# Patient Record
Sex: Male | Born: 1995 | Race: White | Hispanic: No | Marital: Single | Smoking: Never smoker
Health system: Southern US, Community
[De-identification: ages and names within clinical notes are randomized; demographics above are authoritative.]

## PROBLEM LIST (undated history)

## (undated) HISTORY — PX: TONSILLECTOMY: SUR1361

---
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
## 2014-05-22 ENCOUNTER — Encounter (HOSPITAL_COMMUNITY): Payer: Self-pay | Admitting: Emergency Medicine

## 2014-05-22 ENCOUNTER — Emergency Department (HOSPITAL_COMMUNITY): Payer: Self-pay

## 2014-05-22 ENCOUNTER — Emergency Department (HOSPITAL_COMMUNITY)
Admission: EM | Admit: 2014-05-22 | Discharge: 2014-05-22 | Disposition: A | Discharge status: Home / Self Care | Payer: Self-pay | Attending: Emergency Medicine | Admitting: Emergency Medicine

## 2014-05-22 DIAGNOSIS — L02619 Cutaneous abscess of unspecified foot: Secondary | ICD-10-CM | POA: Insufficient documentation

## 2014-05-22 DIAGNOSIS — L03039 Cellulitis of unspecified toe: Principal | ICD-10-CM | POA: Insufficient documentation

## 2014-05-22 DIAGNOSIS — M79609 Pain in unspecified limb: Secondary | ICD-10-CM | POA: Insufficient documentation

## 2014-05-22 DIAGNOSIS — L03031 Cellulitis of right toe: Secondary | ICD-10-CM

## 2014-05-22 LAB — CBC WITH DIFFERENTIAL/PLATELET
BASOS ABS: 0 10*3/uL (ref 0.0–0.1)
BASOS PCT: 0 % (ref 0–1)
Eosinophils Absolute: 0.1 10*3/uL (ref 0.0–0.7)
Eosinophils Relative: 1 % (ref 0–5)
HCT: 43.7 % (ref 39.0–52.0)
Hemoglobin: 15.3 g/dL (ref 13.0–17.0)
Lymphocytes Relative: 20 % (ref 12–46)
Lymphs Abs: 2.1 10*3/uL (ref 0.7–4.0)
MCH: 30.8 pg (ref 26.0–34.0)
MCHC: 35 g/dL (ref 30.0–36.0)
MCV: 87.9 fL (ref 78.0–100.0)
MONO ABS: 0.8 10*3/uL (ref 0.1–1.0)
Monocytes Relative: 7 % (ref 3–12)
NEUTROS ABS: 7.5 10*3/uL (ref 1.7–7.7)
NEUTROS PCT: 72 % (ref 43–77)
PLATELETS: 258 10*3/uL (ref 150–400)
RBC: 4.97 MIL/uL (ref 4.22–5.81)
RDW: 12.7 % (ref 11.5–15.5)
WBC: 10.6 10*3/uL — ABNORMAL HIGH (ref 4.0–10.5)

## 2014-05-22 LAB — BASIC METABOLIC PANEL
ANION GAP: 15 (ref 5–15)
BUN: 11 mg/dL (ref 6–23)
CHLORIDE: 101 meq/L (ref 96–112)
CO2: 26 mEq/L (ref 19–32)
CREATININE: 1.05 mg/dL (ref 0.50–1.35)
Calcium: 9.4 mg/dL (ref 8.4–10.5)
GFR calc non Af Amer: >90 mL/min (ref >90)
Glucose, Bld: 91 mg/dL (ref 70–99)
POTASSIUM: 3.8 meq/L (ref 3.7–5.3)
SODIUM: 142 meq/L (ref 137–147)

## 2014-05-22 MED ORDER — SULFAMETHOXAZOLE-TRIMETHOPRIM 800-160 MG PO TABS: 1.0000 | ORAL_TABLET | Freq: Two times a day (BID) | ORAL | Status: AC

## 2014-05-22 MED ORDER — CEPHALEXIN 500 MG PO CAPS: 500.0000 mg | ORAL_CAPSULE | Freq: Four times a day (QID) | ORAL | Status: DC

## 2014-05-22 MED ORDER — CEFTRIAXONE SODIUM 1 G IJ SOLR
1.0000 g | Freq: Once | INTRAMUSCULAR | Status: AC
  Administered 2014-05-22: 1 g via INTRAMUSCULAR
  Filled 2014-05-22: qty 10

## 2014-05-22 MED ORDER — LIDOCAINE HCL (PF) 1 % IJ SOLN
INTRAMUSCULAR | Status: AC
  Administered 2014-05-22: 2.1 mL
  Filled 2014-05-22: qty 5

## 2014-05-22 NOTE — ED Notes (Signed)
Patient verbalizes understanding of discharge instructions, follow up care, prescription medications, and home care. Patient ambulatory out of department at this time with family.

## 2014-05-22 NOTE — ED Provider Notes (Signed)
CSN: [id number]     Arrival date & time 05/22/14  1715 History  This chart was scribed for Geoffery Lyons, MD by Gwenyth Ober, ED Scribe. This patient was seen in room APA04/APA04 and the patient's care was started at 6:24 PM.   Chief Complaint  Patient presents with  . Toe Pain   The history is provided by the patient. No language interpreter was used.   HPI Comments: Michael Andersen is a 18 y.o. male who presents to the Emergency Department complaining of constant, right great toe pain and swelling that started 3 days ago. He states he was wearing old boots and developed a blister. He states redness and drainage as associated symptoms. Pt denies a history of DM.   History reviewed. No pertinent past medical history. Past Surgical History  Procedure Laterality Date  . Tonsillectomy     History reviewed. No pertinent family history. History  Substance Use Topics  . Smoking status: Never Smoker   . Smokeless tobacco: Not on file  . Alcohol Use: No    Review of Systems 10 Systems reviewed and all are negative for acute change except as noted in the HPI.   Allergies  Review of patient's allergies indicates no known allergies.  Home Medications   Prior to Admission medications   Not on File   BP 148/88  Pulse 73  Temp(Src) 97.6 F (36.4 C) (Oral)  Resp 18  Ht  (1.854 m)  Wt 250 lb (113.399 kg)  BMI 32.99 kg/m2  SpO2 100% Physical Exam  Nursing note and vitals reviewed. Constitutional: He is oriented to person, place, and time. He appears well-developed and well-nourished. No distress.  HENT:  Head: Normocephalic and atraumatic.  Mouth/Throat: Oropharynx is clear and moist. No oropharyngeal exudate.  Eyes: Pupils are equal, round, and reactive to light.  Neck: Neck supple.  Cardiovascular: Normal rate.   Pulmonary/Chest: Effort normal.  Musculoskeletal: He exhibits no edema.  Neurological: He is alert and oriented to person, place, and time. No cranial nerve  deficit.  Skin: Skin is warm and dry. No rash noted.  The right great toe is warm, swollen, and erythematous. There is a small area at the base of the dorsal aspect that is draining purulent material.   Psychiatric: He has a normal mood and affect. His behavior is normal.    ED Course  Procedures (including critical care time)  DIAGNOSTIC STUDIES: Oxygen Saturation is 100% on RA, normal by my interpretation.    COORDINATION OF CARE: 6:26 PM Will order antibiotic. Discussed plan with pt and pt agreed to plan.     Labs Review Labs Reviewed  CBC WITH DIFFERENTIAL  BASIC METABOLIC PANEL    Imaging Review No results found.   EKG Interpretation None      MDM   Final diagnoses:  None    Purulent drainage from the toe with erythema.  He was given rocephin and will be treated with keflex, bactrim, and prn return.  I personally performed the services described in this documentation, which was scribed in my presence. The recorded information has been reviewed and is accurate.     Geoffery Lyons, MD 05/23/14 [phone]

## 2014-05-22 NOTE — ED Notes (Signed)
Swelling, redness, open area and purulent drainage to right great toe at distal joint. Pt reports he noticed open area this weekend. Denies injury.

## 2014-05-22 NOTE — Discharge Instructions (Signed)
Keflex and Bactrim as prescribed.  Return to the emergency department if your swelling worsens, you develop high fever, increased drainage from the wound, or red streaks up and down the leg.   Cellulitis Cellulitis is an infection of the skin and the tissue beneath it. The infected area is usually red and tender. Cellulitis occurs most often in the arms and lower legs.  CAUSES  Cellulitis is caused by bacteria that enter the skin through cracks or cuts in the skin. The most common types of bacteria that cause cellulitis are staphylococci and streptococci. SIGNS AND SYMPTOMS   Redness and warmth.  Swelling.  Tenderness or pain.  Fever. DIAGNOSIS  Your health care provider can usually determine what is wrong based on a physical exam. Blood tests may also be done. TREATMENT  Treatment usually involves taking an antibiotic medicine. HOME CARE INSTRUCTIONS   Take your antibiotic medicine as directed by your health care provider. Finish the antibiotic even if you start to feel better.  Keep the infected arm or leg elevated to reduce swelling.  Apply a warm cloth to the affected area up to 4 times per day to relieve pain.  Take medicines only as directed by your health care provider.  Keep all follow-up visits as directed by your health care provider. SEEK MEDICAL CARE IF:   You notice red streaks coming from the infected area.  Your red area gets larger or turns dark in color.  Your bone or joint underneath the infected area becomes painful after the skin has healed.  Your infection returns in the same area or another area.  You notice a swollen bump in the infected area.  You develop new symptoms.  You have a fever. SEEK IMMEDIATE MEDICAL CARE IF:   You feel very sleepy.  You develop vomiting or diarrhea.  You have a general ill feeling (malaise) with muscle aches and pains. MAKE SURE YOU:   Understand these instructions.  Will watch your condition.  Will get  help right away if you are not doing well or get worse. Document Released: 05/26/2005 Document Revised: 12/31/2013 Document Reviewed: 11/01/2011 [facility] Patient Information 2015 [place], [place]. This information is not intended to replace advice given to you by your health care provider. Make sure you discuss any questions you have with your health care provider.

## 2015-03-22 ENCOUNTER — Encounter (HOSPITAL_COMMUNITY): Payer: Self-pay | Admitting: Emergency Medicine

## 2015-03-22 ENCOUNTER — Inpatient Hospital Stay (HOSPITAL_COMMUNITY)
Admission: EM | Admit: 2015-03-22 | Discharge: 2015-03-26 | DRG: 603 | Disposition: A | Discharge status: Home / Self Care | Payer: Self-pay | Attending: Internal Medicine | Admitting: Internal Medicine

## 2015-03-22 ENCOUNTER — Emergency Department (HOSPITAL_COMMUNITY): Payer: Self-pay

## 2015-03-22 DIAGNOSIS — L0390 Cellulitis, unspecified: Secondary | ICD-10-CM | POA: Insufficient documentation

## 2015-03-22 DIAGNOSIS — L03211 Cellulitis of face: Principal | ICD-10-CM | POA: Diagnosis present

## 2015-03-22 LAB — BASIC METABOLIC PANEL
ANION GAP: 8 (ref 5–15)
BUN: 10 mg/dL (ref 6–20)
CALCIUM: 9 mg/dL (ref 8.9–10.3)
CO2: 28 mmol/L (ref 22–32)
Chloride: 101 mmol/L (ref 101–111)
Creatinine, Ser: 0.97 mg/dL (ref 0.61–1.24)
GFR calc Af Amer: >60 mL/min (ref >60)
GLUCOSE: 106 mg/dL — AB (ref 65–99)
POTASSIUM: 3.7 mmol/L (ref 3.5–5.1)
SODIUM: 137 mmol/L (ref 135–145)

## 2015-03-22 LAB — CBC WITH DIFFERENTIAL/PLATELET
BASOS ABS: 0 10*3/uL (ref 0.0–0.1)
Basophils Relative: 0 % (ref 0–1)
Eosinophils Absolute: 0.2 10*3/uL (ref 0.0–0.7)
Eosinophils Relative: 1 % (ref 0–5)
HEMATOCRIT: 43.4 % (ref 39.0–52.0)
HEMOGLOBIN: 15.1 g/dL (ref 13.0–17.0)
LYMPHS PCT: 10 % — AB (ref 12–46)
Lymphs Abs: 1.5 10*3/uL (ref 0.7–4.0)
MCH: 30.8 pg (ref 26.0–34.0)
MCHC: 34.8 g/dL (ref 30.0–36.0)
MCV: 88.4 fL (ref 78.0–100.0)
MONO ABS: 1 10*3/uL (ref 0.1–1.0)
MONOS PCT: 7 % (ref 3–12)
NEUTROS ABS: 12.4 10*3/uL — AB (ref 1.7–7.7)
Neutrophils Relative %: 82 % — ABNORMAL HIGH (ref 43–77)
Platelets: 223 10*3/uL (ref 150–400)
RBC: 4.91 MIL/uL (ref 4.22–5.81)
RDW: 12.7 % (ref 11.5–15.5)
WBC: 15.1 10*3/uL — ABNORMAL HIGH (ref 4.0–10.5)

## 2015-03-22 MED ORDER — SODIUM CHLORIDE 0.9 % IJ SOLN
3.0000 mL | Freq: Two times a day (BID) | INTRAMUSCULAR | Status: DC
Start: 2015-03-22 — End: 2015-03-26
  Administered 2015-03-22 – 2015-03-26 (×8): 3 mL via INTRAVENOUS

## 2015-03-22 MED ORDER — HYDROCODONE-ACETAMINOPHEN 5-325 MG PO TABS
1.0000 | ORAL_TABLET | Freq: Once | ORAL | Status: AC
  Administered 2015-03-22: 1 via ORAL
  Filled 2015-03-22: qty 1

## 2015-03-22 MED ORDER — VANCOMYCIN HCL IN DEXTROSE 1-5 GM/200ML-% IV SOLN: 1000.0000 mg | Freq: Two times a day (BID) | INTRAVENOUS | Status: DC

## 2015-03-22 MED ORDER — ACETAMINOPHEN 650 MG RE SUPP: 650.0000 mg | Freq: Four times a day (QID) | RECTAL | Status: DC | PRN

## 2015-03-22 MED ORDER — ONDANSETRON HCL 4 MG/2ML IJ SOLN: 4.0000 mg | Freq: Four times a day (QID) | INTRAMUSCULAR | Status: DC | PRN

## 2015-03-22 MED ORDER — VANCOMYCIN HCL IN DEXTROSE 1-5 GM/200ML-% IV SOLN
1000.0000 mg | Freq: Once | INTRAVENOUS | Status: AC
Start: 2015-03-22 — End: 2015-03-22
  Administered 2015-03-22: 1000 mg via INTRAVENOUS
  Filled 2015-03-22: qty 200

## 2015-03-22 MED ORDER — SODIUM CHLORIDE 0.9 % IV SOLN
250.0000 mL | INTRAVENOUS | Status: DC | PRN
  Administered 2015-03-25: 250 mL via INTRAVENOUS

## 2015-03-22 MED ORDER — VANCOMYCIN HCL IN DEXTROSE 1-5 GM/200ML-% IV SOLN
1000.0000 mg | Freq: Three times a day (TID) | INTRAVENOUS | Status: DC
  Administered 2015-03-23 – 2015-03-26 (×11): 1000 mg via INTRAVENOUS
  Filled 2015-03-22 (×14): qty 200

## 2015-03-22 MED ORDER — HYDROMORPHONE HCL 1 MG/ML IJ SOLN
0.5000 mg | Freq: Once | INTRAMUSCULAR | Status: AC
  Administered 2015-03-22: 0.5 mg via INTRAVENOUS
  Filled 2015-03-22: qty 1

## 2015-03-22 MED ORDER — VANCOMYCIN HCL IN DEXTROSE 1-5 GM/200ML-% IV SOLN
1000.0000 mg | Freq: Once | INTRAVENOUS | Status: AC
  Administered 2015-03-22: 1000 mg via INTRAVENOUS
  Filled 2015-03-22: qty 200

## 2015-03-22 MED ORDER — ACETAMINOPHEN 325 MG PO TABS: 650.0000 mg | ORAL_TABLET | Freq: Four times a day (QID) | ORAL | Status: DC | PRN

## 2015-03-22 MED ORDER — IOHEXOL 300 MG/ML  SOLN
75.0000 mL | Freq: Once | INTRAMUSCULAR | Status: AC | PRN
  Administered 2015-03-22: 75 mL via INTRAVENOUS

## 2015-03-22 MED ORDER — SODIUM CHLORIDE 0.9 % IJ SOLN: 3.0000 mL | INTRAMUSCULAR | Status: DC | PRN

## 2015-03-22 MED ORDER — HYDROMORPHONE HCL 1 MG/ML IJ SOLN
0.5000 mg | INTRAMUSCULAR | Status: DC | PRN
  Administered 2015-03-22 – 2015-03-25 (×19): 0.5 mg via INTRAVENOUS
  Filled 2015-03-22 (×19): qty 1

## 2015-03-22 MED ORDER — HYDROCODONE-ACETAMINOPHEN 5-325 MG PO TABS
1.0000 | ORAL_TABLET | ORAL | Status: DC | PRN
  Administered 2015-03-22: 2 via ORAL
  Filled 2015-03-22: qty 2

## 2015-03-22 MED ORDER — ONDANSETRON HCL 4 MG PO TABS: 4.0000 mg | ORAL_TABLET | Freq: Four times a day (QID) | ORAL | Status: DC | PRN

## 2015-03-22 NOTE — ED Notes (Signed)
Admitting at bedside 

## 2015-03-22 NOTE — Progress Notes (Signed)
ANTIBIOTIC CONSULT NOTE - INITIAL  Pharmacy Consult for Vancomycin Indication: cellulitis  No Known Allergies  Patient Measurements: Height:  (185.4 cm) Weight: 220 lb (99.791 kg) IBW/kg (Calculated) : 79.9 Adjusted Body Weight:   Vital Signs: Temp: 98.1 F (36.7 C) (07/23 0935) Temp Source: Oral (07/23 0935) BP: 150/98 mmHg (07/23 1300) Pulse Rate: 83 (07/23 1300) Intake/Output from previous day:   Intake/Output from this shift:    Labs:  Recent Labs  03/22/15 1024  WBC 15.1*  HGB 15.1  PLT 223  CREATININE 0.97   Estimated Creatinine Clearance: 151 mL/min (by C-G formula based on Cr of 0.97). No results for input(s): VANCOTROUGH, VANCOPEAK, VANCORANDOM, GENTTROUGH, GENTPEAK, GENTRANDOM, TOBRATROUGH, TOBRAPEAK, TOBRARND, AMIKACINPEAK, AMIKACINTROU, AMIKACIN in the last 72 hours.   Microbiology: No results found for this or any previous visit (from the past 720 hour(s)).  Medical History: History reviewed. No pertinent past medical history.  Medications:   (Not in a hospital admission) Assessment: 19 y.o. male who presents to the Emergency Department complaining of constant, worsening left-sided facial pain with associated swelling that has been present for three days. Pt states he tried to pop a pimple on his face two days ago and notes that facial swelling and tenderness have increased since that time. Vancomycin 1 GM IV given in ED Labs reviewed PTA medications reviewed  Goal of Therapy:  Vancomycin trough level 10-15 mcg/ml  Plan:  Additional Vancomycin 1 GM IV, then Vancomycin 1 GM IV every 8 hours Vancomycin trough at steady state Monitor renal function Labs per protocol  Raquel James, Avis Mcmahill Bennett 03/22/2015,2:03 PM

## 2015-03-22 NOTE — Progress Notes (Signed)
PAtient complain of itching after taking pain medications. Dr. Irene Limbo notified.

## 2015-03-22 NOTE — ED Provider Notes (Signed)
CSN: [id number]     Arrival date & time 03/22/15  4782 History  This chart was scribed for Bethann Berkshire, MD by Murriel Hopper, ED Scribe. This patient was seen in room APA18/APA18 and the patient's care was started at 9:58 AM.    Chief Complaint  Patient presents with  . Facial Swelling     Patient is a 19 y.o. male presenting with facial injury. The history is provided by the patient. No language interpreter was used.  Facial Injury Mechanism of injury:  Unable to specify Location:  Face and mouth Mouth location:  Lip(s) Time since incident:  3 days Pain details:    Quality:  Aching   Severity:  Moderate   Timing:  Constant   Progression:  Worsening Chronicity:  New Worsened by:  Nothing tried Associated symptoms: no congestion and no headaches      HPI Comments: Michael Andersen is a 19 y.o. male who presents to the Emergency Department complaining of constant, worsening left-sided facial pain with associated swelling that has been present for three days. Pt states he tried to pop a pimple on his face two days ago and notes that facial swelling and tenderness have increased since then. Pt notes that he went to [facility] yesterday for the same issue and received antibiotics, but notes that it has gotten worse since yesterday when he was seen. Pt denies fever or trying any other treatments for the concern.    History reviewed. No pertinent past medical history. Past Surgical History  Procedure Laterality Date  . Tonsillectomy     Family History  Problem Relation Age of Onset  . Cancer Other    History  Substance Use Topics  . Smoking status: Never Smoker   . Smokeless tobacco: Never Used  . Alcohol Use: No    Review of Systems  Constitutional: Negative for fever, appetite change and fatigue.  HENT: Positive for facial swelling. Negative for congestion, ear discharge and sinus pressure.   Eyes: Negative for discharge.  Respiratory: Negative for cough.    Cardiovascular: Negative for chest pain.  Gastrointestinal: Negative for abdominal pain and diarrhea.  Genitourinary: Negative for frequency and hematuria.  Musculoskeletal: Negative for back pain.  Skin: Negative for rash.  Neurological: Negative for seizures and headaches.  Psychiatric/Behavioral: Negative for hallucinations.  All other systems reviewed and are negative.     Allergies  Review of patient's allergies indicates no known allergies.  Home Medications   Prior to Admission medications   Medication Sig Start Date End Date Taking? Authorizing Provider  ibuprofen (ADVIL,MOTRIN) 200 MG tablet Take 400 mg by mouth every 6 (six) hours as needed (pain).   Yes Historical Provider, MD   BP 159/99 mmHg  Pulse 97  Temp(Src) 98.1 F (36.7 C) (Oral)  Resp 18  Ht 6\' 1"  (1.854 m)  Wt 220 lb (99.791 kg)  BMI 29.03 kg/m2  SpO2 97% Physical Exam  Constitutional: He is oriented to person, place, and time. He appears well-developed.  HENT:  Head: Normocephalic.  Cellulitis 2 cm in diameter above his left upper lip  Eyes: Conjunctivae and EOM are normal. No scleral icterus.  Neck: Neck supple. No tracheal deviation present. No thyromegaly present.  Cardiovascular: Normal rate and regular rhythm.  Exam reveals no gallop and no friction rub.   No murmur heard. Pulmonary/Chest: No stridor. He has no wheezes. He has no rales. He exhibits no tenderness.  Abdominal: He exhibits no distension. There is no tenderness. There is  no rebound.  Musculoskeletal: Normal range of motion. He exhibits no edema.  Lymphadenopathy:    He has no cervical adenopathy.  Neurological: He is oriented to person, place, and time. He exhibits normal muscle tone. Coordination normal.  Skin: Skin is warm. No rash noted. No erythema.  Psychiatric: He has a normal mood and affect. His behavior is normal.    ED Course  Procedures (including critical care time)  DIAGNOSTIC STUDIES: Oxygen Saturation is 97%  on room air, normal by my interpretation.    COORDINATION OF CARE: 10:07 AM Discussed treatment plan with pt at bedside and pt agreed to plan.   Labs Review Labs Reviewed - No data to display  Imaging Review No results found.   EKG Interpretation None      MDM   Final diagnoses:  None    Facial cellulitis admit   The chart was scribed for me under my direct supervision.  I personally performed the history, physical, and medical decision making and all procedures in the evaluation of this patient.Bethann Berkshire, MD 03/22/15 1318

## 2015-03-22 NOTE — ED Notes (Signed)
Patient c/o pain and swelling to left side of face. Per patient had a small pimple in which he tried to "pop" 2 days ago but started getting  Infected yesterday. Patient states he went to [facility] and was only seen in triage and given antibiotics. Per patient woke this morning with increased pain and swelling in face. Denies any difficulty with breathing or swallowing but reports "slight chest pain this morning." Denies any at this time.

## 2015-03-22 NOTE — Progress Notes (Signed)
Vicodin added as allergy. Vicodin order discontinued. MD notified.

## 2015-03-22 NOTE — ED Notes (Signed)
Attempted reprot.

## 2015-03-22 NOTE — H&P (Signed)
History and Physical  Michael Andersen:[id number] DOB: Jun 22, 1996 DOA: 03/22/2015  Referring physician: Dr Estell Harpin PCP: No PCP Per Patient   Chief Complaint: faiale pain  HPI:  19 y/o male with complaint of facial pain and swelling that onset 3-4 days ago gradually after bursting a pimple on his face with mild draining. He was seen at [facility] yesterday for sxs adn placed on abx, which he states did not provide any relief. He woke at 8am today to increased swelling and redness with a minimal drainage. Similar sxs before to the right great toe, he was treated with antibiotics, but not hospitalized. He was cultured in the past, believes it to have been positive for MRSA. He denies fever with the sxs.   In the emergency department afebrile, VSS, and non-hypoxic. Pertinent  labs: WBC 15.1, otherwise CBC unremarkable BMP unremarkable  Imaging:  Not independently reviewed left facial cellulitis, with no abscess.  Review of Systems:  Negative for fever, visual changes, sore throat, rash, new muscle aches, chest pain, SOB, dysuria, bleeding, n/v/d, or abdominal pain.  History reviewed. No pertinent past medical history.  Denies PMH  Past Surgical History  Procedure Laterality Date  . Tonsillectomy      Social History:  reports that he has never smoked. He has never used smokeless tobacco. He reports that he does not drink alcohol or use illicit drugs. Self-care  No Known Allergies  Family History  Problem Relation Age of Onset  . Cancer Other   denies particular problems in family   Prior to Admission medications   Medication Sig Start Date End Date Taking? Authorizing Provider  ibuprofen (ADVIL,MOTRIN) 200 MG tablet Take 400 mg by mouth every 6 (six) hours as needed (pain).   Yes Historical Provider, MD   Physical Exam: Filed Vitals:   03/22/15 1100 03/22/15 1130 03/22/15 1155 03/22/15 1200  BP: 140/99 102/60 102/60 133/77  Pulse:  65 84 84  Temp:      TempSrc:       Resp:   16   Height:      Weight:      SpO2:  99% 98% 100%    Afebrile, VSS, non-hypoxic General:  Appears calm and comfortable Eyes: PERRL, normal lids, irises & conjunctiva, EOMI ENT: grossly normal hearing, lips & tongue, good dentition, no carries.  Neck: no LAD, masses or thyromegaly Cardiovascular: RRR, no m/r/g. No LE edema. Respiratory: CTA bilaterally, no w/r/r. Normal respiratory effort. Abdomen: soft, non-tender, non-distented Skin: The left side of the face, above the lip, is indurated and erythemous with mild tenderness to palpation that extends to the left nare. There is no drainage or fluctuance noted. Musculoskeletal: grossly normal tone BUE/BLE  Psychiatric: grossly normal mood and affect, speech fluent and appropriate Neurologic: grossly non-focal.  Wt Readings from Last 3 Encounters:  03/22/15 99.791 kg (220 lb)  05/22/14 113.399 kg (250 lb) (99 %*, Z = 2.42)  08/21/13 117.935 kg (260 lb) (100 %*, Z = 2.58)   * Growth percentiles are based on CDC 2-20 Years data.    Labs on Admission:  Basic Metabolic Panel:  Recent Labs Lab 03/22/15 1024  NA 137  K 3.7  CL 101  CO2 28  GLUCOSE 106*  BUN 10  CREATININE 0.97  CALCIUM 9.0    CBC:  Recent Labs Lab 03/22/15 1024  WBC 15.1*  NEUTROABS 12.4*  HGB 15.1  HCT 43.4  MCV 88.4  PLT 223     Radiological Exams on Admission:  Ct Maxillofacial W/cm  03/22/2015   CLINICAL DATA:  Left facial swelling, redness and soreness for 2 days. Initial encounter.  EXAM: CT MAXILLOFACIAL WITH CONTRAST  TECHNIQUE: Multidetector CT imaging of the maxillofacial structures was performed with intravenous contrast. Multiplanar CT image reconstructions were also generated. A small metallic BB was placed on the right temple in order to reliably differentiate right from left.  CONTRAST:  75 mL OMNIPAQUE IOHEXOL 300 MG/ML  SOLN  COMPARISON:  None.  FINDINGS: There is infiltration of subcutaneous fat about the left side of the  face centered at the cheek. No focal fluid collection is identified. There is no lymphadenopathy or mass. The orbits appear normal. Imaged intracranial contents appear normal. No dental disease is identified. There is no focal bony abnormality. Imaged paranasal sinuses and mastoid air cells are clear.  IMPRESSION: Infiltration of subcutaneous fat about the left side of the face most consistent with cellulitis. No abscess is identified.   Electronically Signed   By: Drusilla Kanner M.D.   On: 03/22/2015 11:31      Principal Problem:   Facial cellulitis   Assessment/Plan 1. Facial cellulitis without evidance of complicating features. Suspect MRSA by history.   Admission to floor  Begin IV abx, follow clinically  Code Status: FULL  DVT prophylaxis:SCDs Family Communication: Discussed with patient at bedside. Disposition Plan/Anticipated LOS: admitted 1-2 days.  Time spent: 55 minutes  Brendia Sacks, MD  [facility] Pager [phone] 03/22/2015, 1:08 PM  I, Norg'e Tisdol, am scribing for Dr. Melton Alar. Michael Andersen on 03/22/2015 at 1:08 PM.  I have reviewed the above documentation for accuracy and completeness, and I agree with the above. Brendia Sacks, MD

## 2015-03-23 LAB — CBC
HEMATOCRIT: 42.3 % (ref 39.0–52.0)
Hemoglobin: 14.7 g/dL (ref 13.0–17.0)
MCH: 30.9 pg (ref 26.0–34.0)
MCHC: 34.8 g/dL (ref 30.0–36.0)
MCV: 88.9 fL (ref 78.0–100.0)
PLATELETS: 238 10*3/uL (ref 150–400)
RBC: 4.76 MIL/uL (ref 4.22–5.81)
RDW: 12.8 % (ref 11.5–15.5)
WBC: 14 10*3/uL — ABNORMAL HIGH (ref 4.0–10.5)

## 2015-03-23 MED ORDER — IBUPROFEN 600 MG PO TABS
600.0000 mg | ORAL_TABLET | Freq: Four times a day (QID) | ORAL | Status: DC
  Administered 2015-03-23 – 2015-03-26 (×13): 600 mg via ORAL
  Filled 2015-03-23 (×14): qty 1

## 2015-03-23 NOTE — Progress Notes (Signed)
ED/CM noted patient did not have health insurance and/or PCP listed in the computer. Patient was given the [facility] resources handout with information on the clinics, food pantries, and the handout for new health insurance sign-up. Provided drug discount card. Patient expressed appreciation for information received. Also, provided contact information to [facility].

## 2015-03-23 NOTE — Progress Notes (Signed)
PROGRESS NOTE  Michael Andersen ZOX:[id number] DOB: 1995/09/19 DOA: 03/22/2015 PCP: No PCP Per Patient  Summary: 19 y/o male with complaint of facial pain and swelling that onset 3-4 days ago gradually after bursting a pimple on his face with mild draining. He was seen at [facility] yesterday for sxs adn placed on abx, which he states did not provide any relief. He woke at 8am today to increased swelling and redness with a minimal drainage. Similar sxs before to the right great toe, he was treated with antibiotics, but not hospitalized. He was cultured in the past, believes it to have been positive for MRSA. He denies fever with the sxs.  Assessment/Plan: 1. Facial cellulitis without evidance of complicating features. Suspect MRSA by history. Slowly improving with less erythema and edema.   Overall improved  Likely home in next 48 hours.   May be able to change to oral abx in the morning.  Contine IV abx  Code Status: FULL DVT prophylaxis: SCDs Family Communication: Discussed with patient. Understands and has no concerns. 03/23/2015 Disposition Plan: Discharge home  Brendia Sacks, MD  [facility]  Pager [phone] If 7PM-7AM, please contact night-coverage at [email or web address], password [facility] 03/23/2015, 7:16 AM  LOS: 1 day   Consultants:    Procedures:    Antibiotics:  Vancomycin 03/23/15>>>  HPI/Subjective: Pt reports feeling better and noticing the swelling has decreased, but pain consistent. He is able to eat soft foods such as ice cream without difficulty. He denies n/v/, shortness of breath or chest pain.    Objective: Filed Vitals:   03/22/15 1407 03/22/15 1626 03/22/15 2108 03/23/15 0444  BP: 131/77 156/82 165/96 144/81  Pulse: 74 77 95 73  Temp:  98.8 F (37.1 C) 98.9 F (37.2 C) 99.6 F (37.6 C)  TempSrc:  Oral Oral Oral  Resp: 14  16 16   Height:  6\' 1"  (1.854 m)    Weight:  100.88 kg (222 lb 6.4 oz)    SpO2: 97% 98% 100% 97%    Intake/Output Summary  (Last 24 hours) at 03/23/15 0716 Last data filed at 03/23/15 0600  Gross per 24 hour  Intake    680 ml  Output      0 ml  Net    680 ml     Filed Weights   03/22/15 0935 03/22/15 1626  Weight: 99.791 kg (220 lb) 100.88 kg (222 lb 6.4 oz)    Exam:    Afebrile, VSS, non-hypoxic General:  Appears comfortable, calm. Cardiovascular: Regular rate and rhythm, no murmur, rub or gallop. No lower extremity edema. Telemetry: Sinus rhythm, no arrhythmias  Respiratory: Clear to auscultation bilaterally, no wheezes, rales or rhonchi. Normal respiratory effort Skin: Mouth looks good. Face less erythema and less edema of the proximal nare and less induration today. Small amount of drainage. Psychiatric: grossly normal mood and affect, speech fluent and appropriate  New data reviewed:  WBC slightly improved, 14  Otherwise CBC unremarkable  Pertinent data since admission:  CT Scan  Infiltration of subcutaneous fat about the left side of the face most consistent with cellulitis. No abscess is identified.  Pending data:    Scheduled Meds: . sodium chloride  3 mL Intravenous Q12H  . vancomycin  1,000 mg Intravenous Q8H   Continuous Infusions:   Principal Problem:   Facial cellulitis   Time spent 25 minutes  I, Jessica D. Jari Pigg, am scribing for Dr. Melton Alar. Irene Limbo. MD on 03/23/2015 at 7:16 AM   I have reviewed the  above documentation for accuracy and completeness, and I agree with the above. 03/23/2015 Brendia Sacks, MD

## 2015-03-23 NOTE — Progress Notes (Signed)
Utilization review Completed Ciarra Braddy RN BSN   

## 2015-03-24 LAB — BASIC METABOLIC PANEL
ANION GAP: 6 (ref 5–15)
BUN: 14 mg/dL (ref 6–20)
CALCIUM: 9.1 mg/dL (ref 8.9–10.3)
CO2: 30 mmol/L (ref 22–32)
Chloride: 102 mmol/L (ref 101–111)
Creatinine, Ser: 0.9 mg/dL (ref 0.61–1.24)
GFR calc Af Amer: >60 mL/min (ref >60)
Glucose, Bld: 97 mg/dL (ref 65–99)
Potassium: 4.1 mmol/L (ref 3.5–5.1)
Sodium: 138 mmol/L (ref 135–145)

## 2015-03-24 LAB — CBC
HEMATOCRIT: 43.5 % (ref 39.0–52.0)
Hemoglobin: 14.9 g/dL (ref 13.0–17.0)
MCH: 30.7 pg (ref 26.0–34.0)
MCHC: 34.3 g/dL (ref 30.0–36.0)
MCV: 89.7 fL (ref 78.0–100.0)
PLATELETS: 243 10*3/uL (ref 150–400)
RBC: 4.85 MIL/uL (ref 4.22–5.81)
RDW: 13 % (ref 11.5–15.5)
WBC: 10.8 10*3/uL — AB (ref 4.0–10.5)

## 2015-03-24 LAB — HIV ANTIBODY (ROUTINE TESTING W REFLEX): HIV Screen 4th Generation wRfx: NONREACTIVE

## 2015-03-24 MED ORDER — OXYCODONE-ACETAMINOPHEN 5-325 MG PO TABS
1.0000 | ORAL_TABLET | ORAL | Status: DC | PRN
  Administered 2015-03-24 – 2015-03-25 (×3): 1 via ORAL
  Filled 2015-03-24 (×4): qty 1

## 2015-03-24 NOTE — Progress Notes (Signed)
  PROGRESS NOTE  Michael Andersen XBJ:[id number] DOB: [birth year] DOA: 03/22/2015 PCP: No PCP Per Patient  Summary: 19 y/o male with complaint of facial pain and swelling that onset 3-4 days ago gradually after bursting a pimple on his face with mild draining. He was seen at [facility] yesterday for sxs adn placed on abx, which he states did not provide any relief. He woke at 8am today to increased swelling and redness with a minimal drainage. Similar sxs before to the right great toe, he was treated with antibiotics, but not hospitalized. He was cultured in the past, believes it to have been positive for MRSA. He denies fever with the sxs.  Assessment/Plan: 1. Facial cellulitis without evidance of complicating features. Continues to improve. Some "dependent" type edema but much less erythema.   Continue IV abx today. Likely change to PO in AM.  Anticipate d/c 7/26 if continues to improve.  Code Status: FULL DVT prophylaxis: SCDs Family Communication: none present. Patient alert and understands plan Disposition Plan: home when improved  Brendia Sacks, MD  [facility]  Pager [phone] If 7PM-7AM, please contact night-coverage at [email or web address], password [facility] 03/24/2015, 8:38 AM  LOS: 2 days   Consultants:    Procedures:    Antibiotics:  Vancomycin 03/23/15>>>  HPI/Subjective: Less erythema and edema of lip. More facial swelling. Overall better. Pain controlled. Eating well.  Objective: Filed Vitals:   03/23/15 0444 03/23/15 1427 03/23/15 2222 03/24/15 0629  BP: 144/81 142/82 145/70 144/68  Pulse: 73 74 63 84  Temp: 99.6 F (37.6 C) 99.1 F (37.3 C) 98 F (36.7 C) 97.9 F (36.6 C)  TempSrc: Oral Oral Oral Oral  Resp: Height:      Weight:      SpO2: 97% 99% 99% 95%    Intake/Output Summary (Last 24 hours) at 03/24/15 0838 Last data filed at 03/23/15 1700  Gross per 24 hour  Intake    720 ml  Output      0 ml  Net    720 ml     Filed Weights     03/22/15 0935 03/22/15 1626  Weight: 99.791 kg (220 lb) 100.88 kg (222 lb 6.4 oz)    Exam:    Afebrile, VSS, no hypoxia General: Appears calm and comfortable Cardiovascular: RRR, no m/r/g.  Respiratory: CTA bilaterally, no w/r/r. Normal respiratory effort. Skin: less edema upper left lip, less erythema of upper left lip and left nare; more swelling over maxillary area.  Psychiatric: grossly normal mood and affect, speech fluent and appropriate  New data reviewed:  WBC continues to trend downwards, 10.8  Pertinent data since admission:  CT Scan  Infiltration of subcutaneous fat about the left side of the face most consistent with cellulitis. No abscess is identified.  Pending data:    Scheduled Meds: . ibuprofen  600 mg Oral QID  . sodium chloride  3 mL Intravenous Q12H  . vancomycin  1,000 mg Intravenous Q8H   Continuous Infusions:   Principal Problem:   Facial cellulitis   Time spent 15 minutes

## 2015-03-24 NOTE — Care Management Note (Addendum)
Case Management Note  Patient Details  Name: ZENO HICKEL MRN: [id number] Date of Birth: 09-Sep-1995  Expected Discharge Date:                  Expected Discharge Plan:  Home/Self Care  In-House Referral:  NA, Financial Counselor  Discharge planning Services  CM Consult  Post Acute Care Choice:  NA Choice offered to:  NA  DME Arranged:    DME Agency:     HH Arranged:    HH Agency:     Status of Service:  In process, will continue to follow  Medicare Important Message Given:    Date Medicare IM Given:    Medicare IM give by:    Date Additional Medicare IM Given:    Additional Medicare Important Message give by:     If discussed at Long Length of Stay Meetings, dates discussed:    Additional Comments: Patient is from home and independent with ADL's. Pt uninsured and unemployed. Pt referred to financial counselor. Pt has no PCP, CM offered to set patient up with appointment at the [facility] or [facility] health dept. But pt would like to arrange for PCP follow up himself. Pt will be discharged on PO abx, if abx not on $4 list patient will be given [facility] voucher. Will cont to follow, anticipate DC 03/25/2015.  Malcolm Metro, RN 03/24/2015, 1:38 PM

## 2015-03-25 NOTE — Progress Notes (Signed)
0827 Facial edema to LEFT side of patient's face noted to be increased this AM. Patient reported possibly sleeping on that side of his face during the night. HOB elevated and patient sitting up in bed at this time. MD notified.

## 2015-03-25 NOTE — Progress Notes (Signed)
ANTIBIOTIC CONSULT NOTE  Pharmacy Consult for Vancomycin Indication: cellulitis  Allergies  Allergen Reactions  . Vicodin [Hydrocodone-Acetaminophen] Itching    Patient Measurements: Height:  (185.4 cm) Weight: 222 lb 6.4 oz (100.88 kg) IBW/kg (Calculated) : 79.9 Adjusted Body Weight:   Vital Signs: Temp: 98.6 F (37 C) (07/26 0621) Temp Source: Oral (07/26 0621) BP: 127/74 mmHg (07/26 0621) Pulse Rate: 72 (07/26 0621) Intake/Output from previous day: 07/25 0701 - 07/26 0700 In: 240 [P.O.:240] Out: -  Intake/Output from this shift:    Labs:  Recent Labs  03/23/15 0604 03/24/15 0554  WBC 14.0* 10.8*  HGB 14.7 14.9  PLT 238 243  CREATININE  --  0.90   Estimated Creatinine Clearance: 163.5 mL/min (by C-G formula based on Cr of 0.9). No results for input(s): VANCOTROUGH, VANCOPEAK, VANCORANDOM, GENTTROUGH, GENTPEAK, GENTRANDOM, TOBRATROUGH, TOBRAPEAK, TOBRARND, AMIKACINPEAK, AMIKACINTROU, AMIKACIN in the last 72 hours.   Microbiology: No results found for this or any previous visit (from the past 720 hour(s)).  Medical History: History reviewed. No pertinent past medical history.  Medications:  Prescriptions prior to admission  Medication Sig Dispense Refill Last Dose  . ibuprofen (ADVIL,MOTRIN) 200 MG tablet Take 400 mg by mouth every 6 (six) hours as needed (pain).   03/21/2015 at Unknown time   Assessment: 19 y.o. male who presents to the Emergency Department complaining of constant, worsening left-sided facial pain with associated swelling that has been present for three days. Pt states he tried to pop a pimple on his face two days ago and notes that facial swelling and tenderness have increased since that time. Cellulitis is worse today L infraorbital area. Renal function stable.  WBC decreased, Afeb  Goal of Therapy:  Vancomycin trough level 10-15 mcg/ml  Plan:  Cont Vancomycin 1 GM IV every 8 hours Will check vanc trough if therapy continues  tomorrow Continue to monitor renal function, clinical progress   Dorlis Judice Poteet 03/25/2015,11:10 AM

## 2015-03-25 NOTE — Progress Notes (Signed)
[facility]                                                                              Patient Demographics  Michael Andersen, is a 19 y.o. male, DOB - 15-Apr-1996, ZOX:[id number]  Admit date - 03/22/2015   Admitting Physician Standley Brooking, MD  Outpatient Primary MD for the patient is No PCP Per Patient  LOS - 3   Chief Complaint  Patient presents with  . Facial Swelling      Summary: 19 y/o male with complaint of facial pain and swelling that onset 3-4 days ago gradually after bursting a pimple on his face with mild draining. He was seen at [facility] yesterday for sxs adn placed on abx, which he states did not provide any relief. He woke at 8am today to increased swelling and redness with a minimal drainage. Similar sxs before to the right great toe, he was treated with antibiotics, but not hospitalized. He was cultured in the past, believes it to have been positive for MRSA. He denies fever with the sxs.   Assessment & Plan   Facial cellulitis -Per patient, increased swelling noted on the left nasal bridge extending to left maxillary and infraorbital area -Erythema does appear to be improving -Will continue IV and a biotics today given that patient's cellulitis has worsened -Currently patient is afebrile with improving leukocytosis -CT maxillofacial showed infiltrations obtain a stat about the left side of the face most consistent with cellulitis, no abscess -Will also order warm compresses to be applied  Code Status: Full  Family Communication: None at bedside  Disposition Plan: Admitted.  Possible discharge 03/26/2015 is cellulitis has improved  Time Spent in minutes   30 minutes  Procedures  None  Consults   None  DVT Prophylaxis  SCDs  Lab Results  Component Value Date   PLT 243 03/24/2015    Medications  Scheduled Meds: . ibuprofen  600 mg Oral QID  . sodium chloride  3 mL Intravenous Q12H  . vancomycin  1,000 mg Intravenous Q8H   Continuous  Infusions:  PRN Meds:.sodium chloride, acetaminophen **OR** acetaminophen, HYDROmorphone (DILAUDID) injection, ondansetron **OR** ondansetron (ZOFRAN) IV, oxyCODONE-acetaminophen, sodium chloride  Antibiotics    Anti-infectives    Start     Dose/Rate Route Frequency Ordered Stop   03/23/15 0000  vancomycin (VANCOCIN) IVPB 1000 mg/200 mL premix     1,000 mg 200 mL/hr over 60 Minutes Intravenous Every 8 hours 03/22/15 1438     03/22/15 1445  vancomycin (VANCOCIN) IVPB 1000 mg/200 mL premix     1,000 mg 200 mL/hr over 60 Minutes Intravenous  Once 03/22/15 1438 03/22/15 1645   03/22/15 1415  vancomycin (VANCOCIN) IVPB 1000 mg/200 mL premix  Status:  Discontinued     1,000 mg 200 mL/hr over 60 Minutes Intravenous Every 12 hours 03/22/15 1412 03/22/15 1412   03/22/15 1015  vancomycin (VANCOCIN) IVPB 1000 mg/200 mL premix     1,000 mg 200 mL/hr over 60 Minutes Intravenous  Once 03/22/15 1007 03/22/15 1151      Subjective:   Michael Andersen seen and examined today.  Patient states he is doing okay. Feels that his infection has worsened  some particularly under the left eye with some hardness as well as swelling. Patient does also complain of tenderness. Patient states that the infection first started as a pimple above the lip and had migrated to the left mandibular area however that has resolved. Patient currently denies any chest pain, shortness of breath, abdominal pain, nausea, vomiting, diarrhea, constipation, dizziness, visual changes.   Objective:   Filed Vitals:   03/24/15 0629 03/24/15 1558 03/24/15 2151 03/25/15 0621  BP: 144/68 136/63 128/81 127/74  Pulse: 84 66 81 72  Temp: 97.9 F (36.6 C) 98 F (36.7 C) 98.5 F (36.9 C) 98.6 F (37 C)  TempSrc: Oral Oral Oral Oral  Resp: 18 20 20 20   Height:      Weight:      SpO2: 95% 99% 100% 100%    Wt Readings from Last 3 Encounters:  03/22/15 100.88 kg (222 lb 6.4 oz)  05/22/14 113.399 kg (250 lb) (99 %*, Z = 2.42)  08/21/13  117.935 kg (260 lb) (100 %*, Z = 2.58)   * Growth percentiles are based on CDC 2-20 Years data.     Intake/Output Summary (Last 24 hours) at 03/25/15 1055 Last data filed at 03/25/15 0200  Gross per 24 hour  Intake    240 ml  Output      0 ml  Net    240 ml    Exam  General: Well developed, well nourished, NAD, appears stated age  HEENT: NCAT, Slight erythema and edema noted on left maxillary and intraocular region, small area-firm to palpation on left maxillary and nasal areas, mucous membranes moist.   Cardiovascular: S1 S2 auscultated, no rubs, murmurs or gallops. Regular rate and rhythm.  Respiratory: Clear to auscultation bilaterally with equal chest rise  Abdomen: Soft, nontender, nondistended, + bowel sounds  Extremities: warm dry without cyanosis clubbing or edema  Neuro: AAOx3, nonfocal  Psych: Normal affect and demeanor with intact judgement and insight  Data Review   Micro Results No results found for this or any previous visit (from the past 240 hour(s)).  Radiology Reports Ct Maxillofacial W/cm  03/22/2015   CLINICAL DATA:  Left facial swelling, redness and soreness for 2 days. Initial encounter.  EXAM: CT MAXILLOFACIAL WITH CONTRAST  TECHNIQUE: Multidetector CT imaging of the maxillofacial structures was performed with intravenous contrast. Multiplanar CT image reconstructions were also generated. A small metallic BB was placed on the right temple in order to reliably differentiate right from left.  CONTRAST:  75 mL OMNIPAQUE IOHEXOL 300 MG/ML  SOLN  COMPARISON:  None.  FINDINGS: There is infiltration of subcutaneous fat about the left side of the face centered at the cheek. No focal fluid collection is identified. There is no lymphadenopathy or mass. The orbits appear normal. Imaged intracranial contents appear normal. No dental disease is identified. There is no focal bony abnormality. Imaged paranasal sinuses and mastoid air cells are clear.  IMPRESSION:  Infiltration of subcutaneous fat about the left side of the face most consistent with cellulitis. No abscess is identified.   Electronically Signed   By: Drusilla Kanner M.D.   On: 03/22/2015 11:31    CBC  Recent Labs Lab 03/22/15 1024 03/23/15 0604 03/24/15 0554  WBC 15.1* 14.0* 10.8*  HGB 15.1 14.7 14.9  HCT 43.4 42.3 43.5  PLT 223 238 243  MCV 88.4 88.9 89.7  MCH 30.8 30.9 30.7  MCHC 34.8 34.8 34.3  RDW 12.7 12.8 13.0  LYMPHSABS 1.5  --   --  MONOABS 1.0  --   --   EOSABS 0.2  --   --   BASOSABS 0.0  --   --     Chemistries   Recent Labs Lab 03/22/15 1024 03/24/15 0554  NA 137 138  K 3.7 4.1  CL 101 102  CO2 28 30  GLUCOSE 106* 97  BUN 10 14  CREATININE 0.97 0.90  CALCIUM 9.0 9.1   ------------------------------------------------------------------------------------------------------------------ estimated creatinine clearance is 163.5 mL/min (by C-G formula based on Cr of 0.9). ------------------------------------------------------------------------------------------------------------------ No results for input(s): HGBA1C in the last 72 hours. ------------------------------------------------------------------------------------------------------------------ No results for input(s): CHOL, HDL, LDLCALC, TRIG, CHOLHDL, LDLDIRECT in the last 72 hours. ------------------------------------------------------------------------------------------------------------------ No results for input(s): TSH, T4TOTAL, T3FREE, THYROIDAB in the last 72 hours.  Invalid input(s): FREET3 ------------------------------------------------------------------------------------------------------------------ No results for input(s): VITAMINB12, FOLATE, FERRITIN, TIBC, IRON, RETICCTPCT in the last 72 hours.  Coagulation profile No results for input(s): INR, PROTIME in the last 168 hours.  No results for input(s): DDIMER in the last 72 hours.  Cardiac Enzymes No results for input(s): CKMB,  TROPONINI, MYOGLOBIN in the last 168 hours.  Invalid input(s): CK ------------------------------------------------------------------------------------------------------------------ Invalid input(s): POCBNP    Wassim Kirksey D.O. on 03/25/2015 at 10:55 AM  Between 7am to 7pm - Pager - [phone]  After 7pm go to [email or web address] - password [email or web address]  And look for the night coverage person covering for me after hours  [facility] Office  [phone]

## 2015-03-26 DIAGNOSIS — L03211 Cellulitis of face: Principal | ICD-10-CM

## 2015-03-26 LAB — CBC
HEMATOCRIT: 41.8 % (ref 39.0–52.0)
HEMOGLOBIN: 14 g/dL (ref 13.0–17.0)
MCH: 30 pg (ref 26.0–34.0)
MCHC: 33.5 g/dL (ref 30.0–36.0)
MCV: 89.5 fL (ref 78.0–100.0)
Platelets: 269 10*3/uL (ref 150–400)
RBC: 4.67 MIL/uL (ref 4.22–5.81)
RDW: 12.8 % (ref 11.5–15.5)
WBC: 8.1 10*3/uL (ref 4.0–10.5)

## 2015-03-26 LAB — BASIC METABOLIC PANEL
Anion gap: 6 (ref 5–15)
BUN: 17 mg/dL (ref 6–20)
CALCIUM: 8.9 mg/dL (ref 8.9–10.3)
CO2: 30 mmol/L (ref 22–32)
CREATININE: 0.9 mg/dL (ref 0.61–1.24)
Chloride: 104 mmol/L (ref 101–111)
GFR calc Af Amer: >60 mL/min (ref >60)
GFR calc non Af Amer: >60 mL/min (ref >60)
Glucose, Bld: 97 mg/dL (ref 65–99)
Potassium: 4.2 mmol/L (ref 3.5–5.1)
Sodium: 140 mmol/L (ref 135–145)

## 2015-03-26 MED ORDER — DOXYCYCLINE HYCLATE 100 MG PO TABS: 100.0000 mg | ORAL_TABLET | Freq: Two times a day (BID) | ORAL | Status: AC

## 2015-03-26 NOTE — Progress Notes (Signed)
Removed IV, pt tolerated well.  Reviewed discharge instrucions with pt, answered all questions at this time.

## 2015-03-26 NOTE — Discharge Summary (Signed)
Physician Discharge Summary  Michael Andersen ZOX:[id number] DOB: [birth year] DOA: 03/22/2015  PCP: No PCP Per Patient  Admit date: 03/22/2015 Discharge date: 03/26/2015  Time spent: 45 minutes  Recommendations for Outpatient Follow-up:  -Will be discharged home today. -Advised to follow-up with his new primary care provider within the next 2 weeks.   Discharge Diagnoses:  Principal Problem:   Facial cellulitis   Discharge Condition: Stable and improved  Filed Weights   03/22/15 0935 03/22/15 1626  Weight: 99.791 kg (220 lb) 100.88 kg (222 lb 6.4 oz)    History of present illness:  19 y/o male with complaint of facial pain and swelling that onset 3-4 days ago gradually after bursting a pimple on his face with mild draining. He was seen at [facility] yesterday for sxs adn placed on abx, which he states did not provide any relief. He woke at 8am today to increased swelling and redness with a minimal drainage. Similar sxs before to the right great toe, he was treated with antibiotics, but not hospitalized. He was cultured in the past, believes it to have been positive for MRSA. He denies fever with the sxs.   In the emergency department afebrile, VSS, and non-hypoxic.  Hospital Course:   Facial cellulitis -Hardly any erythema or edema today. -Per patient much improved. -Has been afebrile and leukocytosis has resolved. -We'll transition oral doxycycline to complete 10 more days and advised follow-up with new primary care provider within 2 weeks. -Stable for discharge home today.  Procedures:  None   Consultations:  None  Discharge Instructions     Medication List    TAKE these medications        doxycycline 100 MG tablet  Commonly known as:  VIBRA-TABS  Take 1 tablet (100 mg total) by mouth 2 (two) times daily.  Notes to Patient:  NEW PRESCRIPTION- Take as directed     ibuprofen 200 MG tablet  Commonly known as:  ADVIL,MOTRIN  Take 400 mg by mouth every 6 (six)  hours as needed (pain).       Allergies  Allergen Reactions  . Vicodin [Hydrocodone-Acetaminophen] Itching       Follow-up Information    Follow up In 2 weeks.   Why:  with your new primary care provider       The results of significant diagnostics from this hospitalization (including imaging, microbiology, ancillary and laboratory) are listed below for reference.    Significant Diagnostic Studies: Ct Maxillofacial W/cm  03/22/2015   CLINICAL DATA:  Left facial swelling, redness and soreness for 2 days. Initial encounter.  EXAM: CT MAXILLOFACIAL WITH CONTRAST  TECHNIQUE: Multidetector CT imaging of the maxillofacial structures was performed with intravenous contrast. Multiplanar CT image reconstructions were also generated. A small metallic BB was placed on the right temple in order to reliably differentiate right from left.  CONTRAST:  75 mL OMNIPAQUE IOHEXOL 300 MG/ML  SOLN  COMPARISON:  None.  FINDINGS: There is infiltration of subcutaneous fat about the left side of the face centered at the cheek. No focal fluid collection is identified. There is no lymphadenopathy or mass. The orbits appear normal. Imaged intracranial contents appear normal. No dental disease is identified. There is no focal bony abnormality. Imaged paranasal sinuses and mastoid air cells are clear.  IMPRESSION: Infiltration of subcutaneous fat about the left side of the face most consistent with cellulitis. No abscess is identified.   Electronically Signed   By: Drusilla Kanner M.D.   On: 03/22/2015  11:31    Microbiology: No results found for this or any previous visit (from the past 240 hour(s)).   Labs: Basic Metabolic Panel:  Recent Labs Lab 03/22/15 1024 03/24/15 0554 03/26/15 0545  NA 137 138 140  K 3.7 4.1 4.2  CL 101 102 104  CO2 28 30 30   GLUCOSE 106* 97 97  BUN 10 14 17   CREATININE 0.97 0.90 0.90  CALCIUM 9.0 9.1 8.9   Liver Function Tests: No results for input(s): AST, ALT, ALKPHOS,  BILITOT, PROT, ALBUMIN in the last 168 hours. No results for input(s): LIPASE, AMYLASE in the last 168 hours. No results for input(s): AMMONIA in the last 168 hours. CBC:  Recent Labs Lab 03/22/15 1024 03/23/15 0604 03/24/15 0554 03/26/15 0545  WBC 15.1* 14.0* 10.8* 8.1  NEUTROABS 12.4*  --   --   --   HGB 15.1 14.7 14.9 14.0  HCT 43.4 42.3 43.5 41.8  MCV 88.4 88.9 89.7 89.5  PLT 223 238 243 269   Cardiac Enzymes: No results for input(s): CKTOTAL, CKMB, CKMBINDEX, TROPONINI in the last 168 hours. BNP: BNP (last 3 results) No results for input(s): BNP in the last 8760 hours.  ProBNP (last 3 results) No results for input(s): PROBNP in the last 8760 hours.  CBG: No results for input(s): GLUCAP in the last 168 hours.     SignedChaya Jan  [facility] Pager: [phone] 03/26/2015, 4:51 PM

## 2015-03-26 NOTE — Care Management Note (Signed)
Case Management Note  Patient Details  Name: LAURICE KIMMONS MRN: [id number] Date of Birth: [birth year]  Expected Discharge Date:                  Expected Discharge Plan:  Home/Self Care  In-House Referral:  NA, Financial Counselor  Discharge planning Services  CM Consult, [facility]  Post Acute Care Choice:  NA Choice offered to:  NA  DME Arranged:    DME Agency:     HH Arranged:    HH Agency:     Status of Service:  Completed, signed off  Medicare Important Message Given:    Date Medicare IM Given:    Medicare IM give by:    Date Additional Medicare IM Given:    Additional Medicare Important Message give by:     If discussed at Long Length of Stay Meetings, dates discussed:    Additional Comments: Pt discharged home with self care today. Pt given [facility] voucher for doxycycline.  Malcolm Metro, RN 03/26/2015, 2:19 PM

## 2016-01-30 IMAGING — CT CT MAXILLOFACIAL W/ CM
3 of 5 series · 16 of 47 positions shown, 19 images · IV contrast (Omnipaque 300)
Comparison: None.

CLINICAL DATA: Left facial swelling, redness and soreness for 2
days. Initial encounter.

EXAM:
CT MAXILLOFACIAL WITH CONTRAST
TECHNIQUE: Multidetector CT imaging of the maxillofacial structures was
performed with intravenous contrast. Multiplanar CT image
reconstructions were also generated. A small metallic BB was placed
on the right temple in order to reliably differentiate right from
left.
CONTRAST:  75 mL OMNIPAQUE IOHEXOL 300 MG/ML  SOLN

[Series 2: max st axial · axial · 0.44mm/px · z∈[+9,+159]mm · 11 of 89 slices shown, 14 images]
[im 7/89  brain]
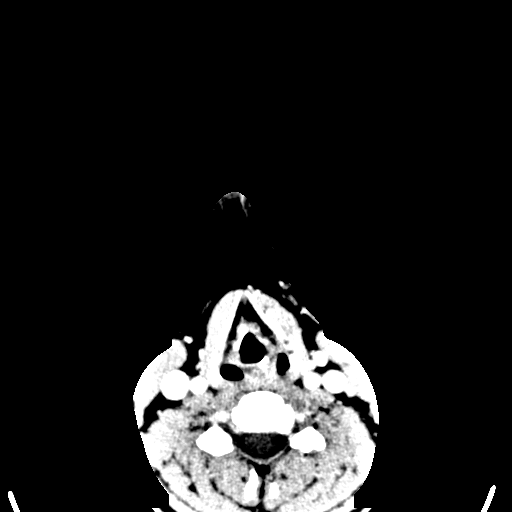
[im 7/89  bone]
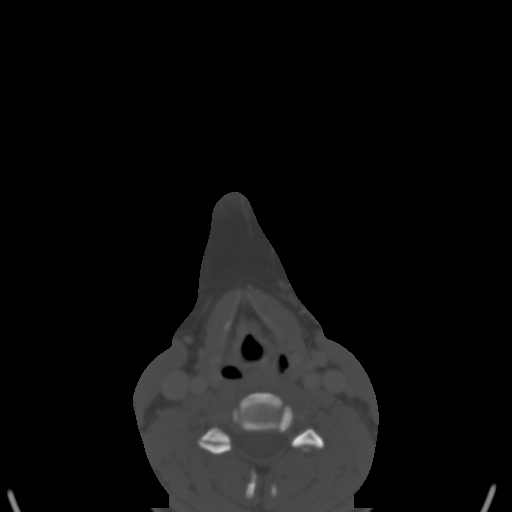
[im 13/89  bone]
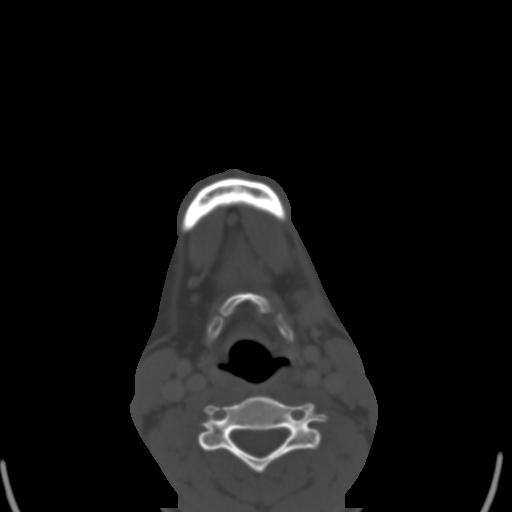
[im 22/89  bone]
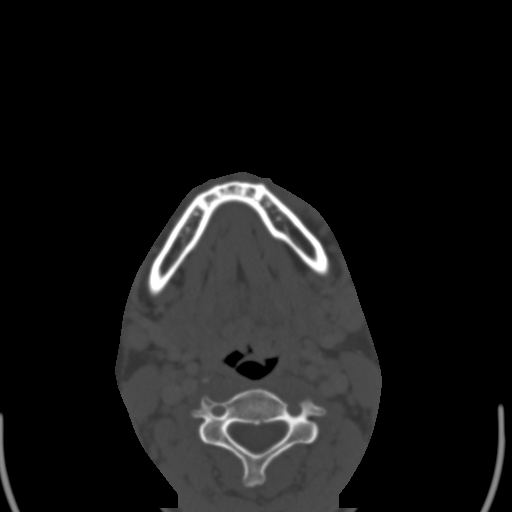
[im 28/89  bone]
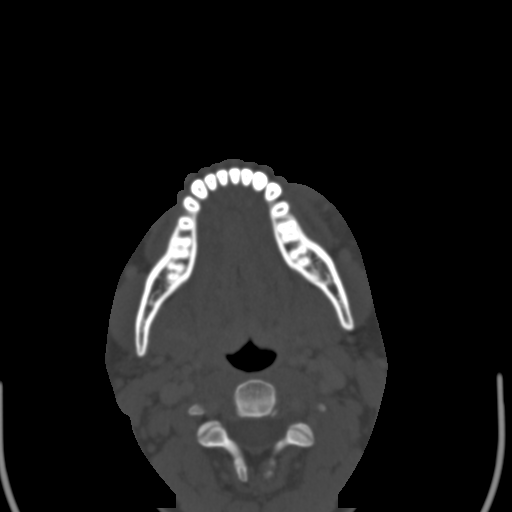
[im 37/89  brain]
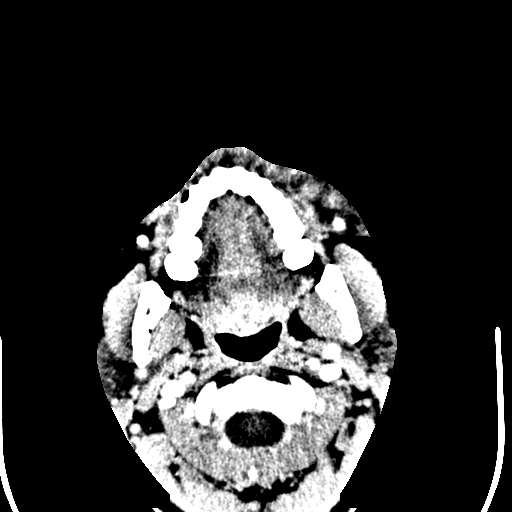
[im 37/89  bone]
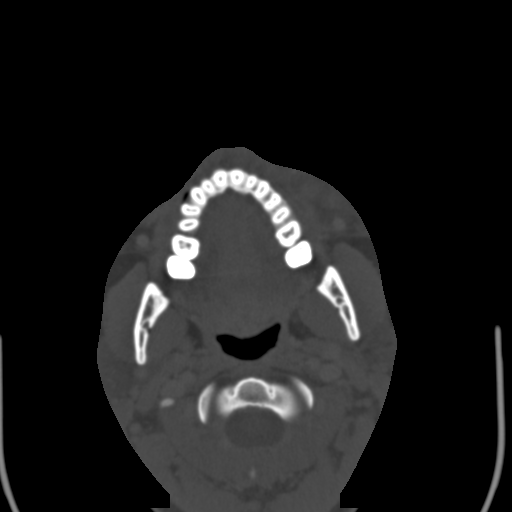
[im 46/89  bone]
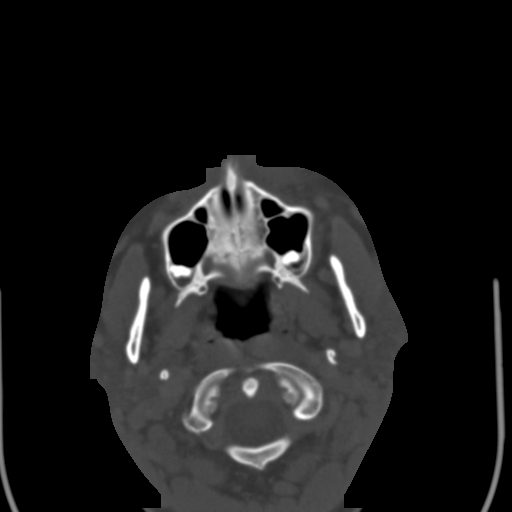
[im 52/89  bone]
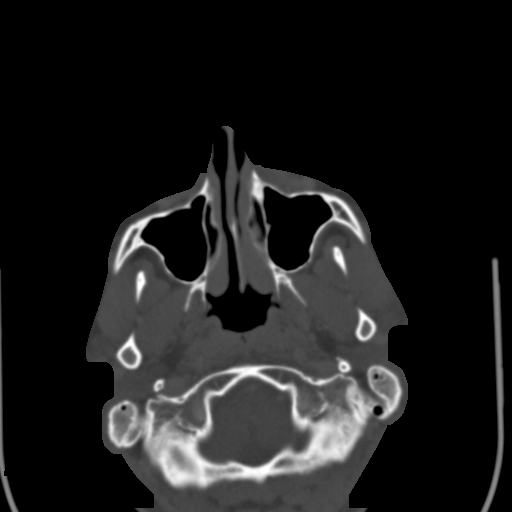
[im 61/89  bone]
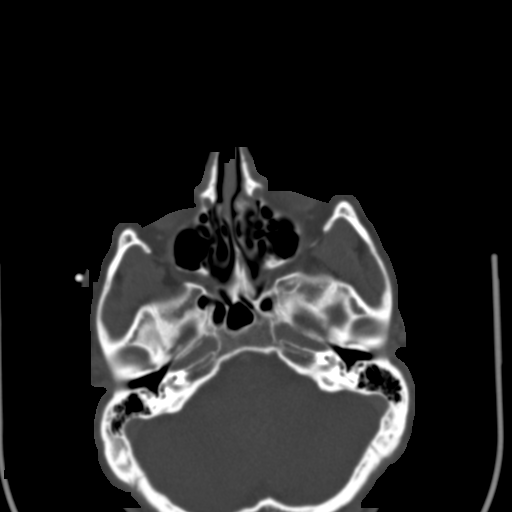
[im 67/89  brain]
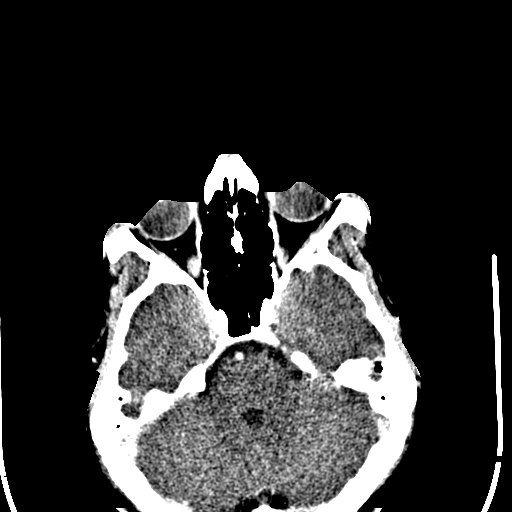
[im 67/89  bone]
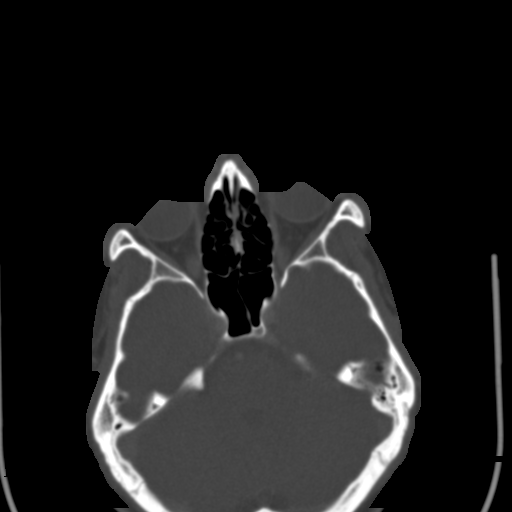
[im 76/89  bone]
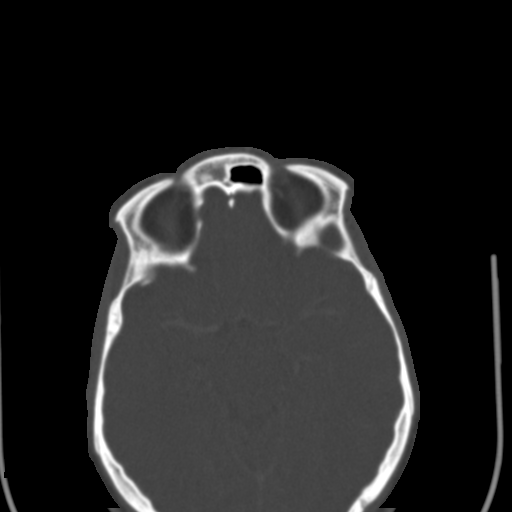
[im 82/89  bone]
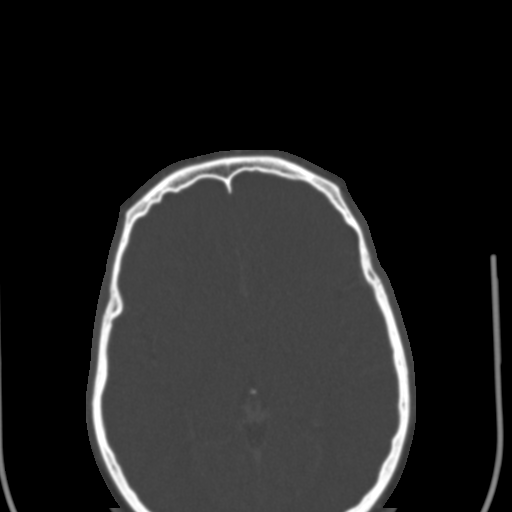

[Series 6: max bone coro · coronal · 0.36mm/px · 3 of 138 slices shown]
[im 28/138  bone]
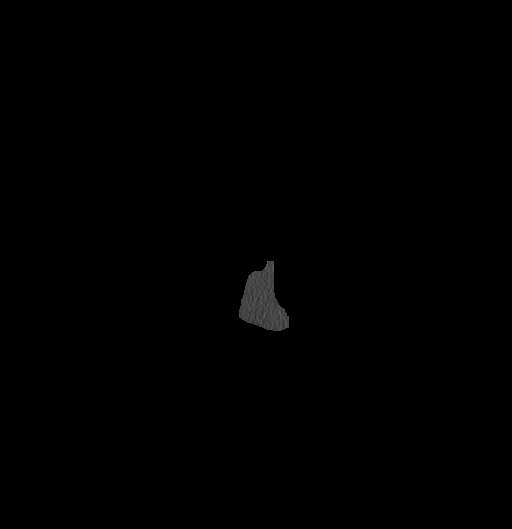
[im 55/138  bone]
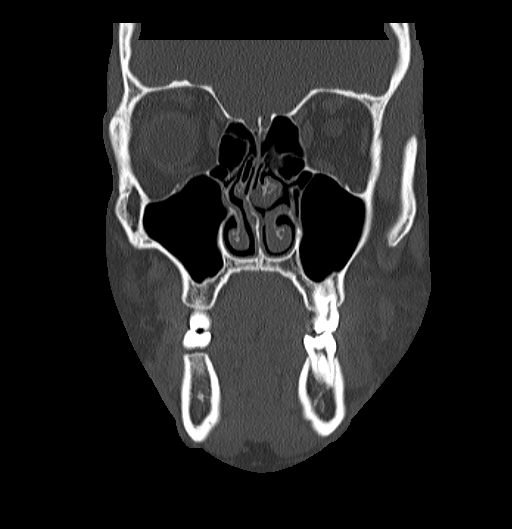
[im 83/138  bone]
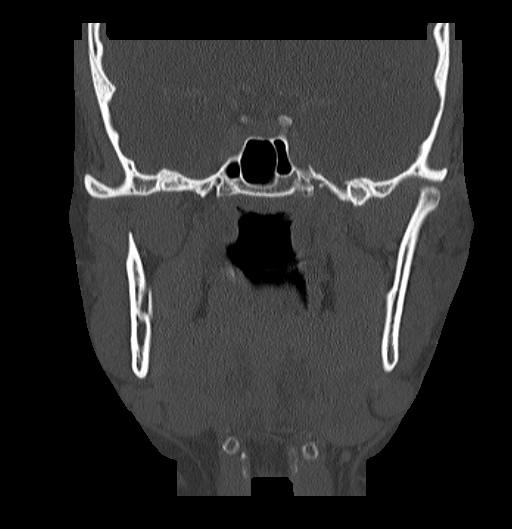

[Series 7: max bone sag · sagittal · 0.35mm/px · 2 of 130 slices shown]
[im 44/130  bone]
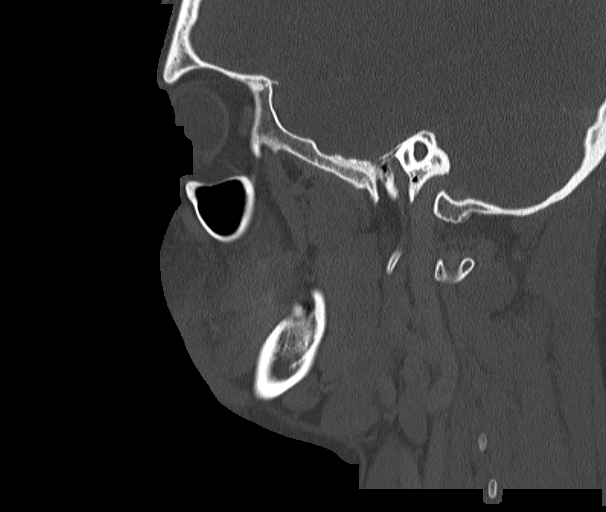
[im 87/130  bone]
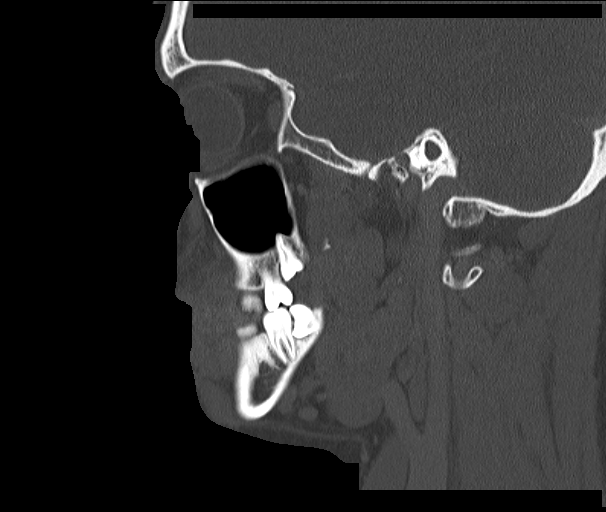

[16 of 47 positions shown; findings below may reference images not displayed]

FINDINGS: There is infiltration of subcutaneous fat about the left side of the
face centered at the cheek. No focal fluid collection is identified.
There is no lymphadenopathy or mass. The orbits appear normal.
Imaged intracranial contents appear normal. No dental disease is
identified. There is no focal bony abnormality. Imaged paranasal
sinuses and mastoid air cells are clear.
IMPRESSION: Infiltration of subcutaneous fat about the left side of the face
most consistent with cellulitis. No abscess is identified.

## 2019-11-01 ENCOUNTER — Encounter (HOSPITAL_COMMUNITY): Payer: Self-pay | Admitting: Emergency Medicine

## 2019-11-01 ENCOUNTER — Emergency Department (HOSPITAL_COMMUNITY)
Admission: EM | Admit: 2019-11-01 | Discharge: 2019-11-01 | Disposition: A | Discharge status: Home / Self Care | Payer: Self-pay | Attending: Emergency Medicine | Admitting: Emergency Medicine

## 2019-11-01 ENCOUNTER — Other Ambulatory Visit: Payer: Self-pay

## 2019-11-01 DIAGNOSIS — L0201 Cutaneous abscess of face: Secondary | ICD-10-CM | POA: Insufficient documentation

## 2019-11-01 DIAGNOSIS — Z79899 Other long term (current) drug therapy: Secondary | ICD-10-CM | POA: Insufficient documentation

## 2019-11-01 LAB — BASIC METABOLIC PANEL
Anion gap: 6 (ref 5–15)
BUN: 18 mg/dL (ref 6–20)
CO2: 28 mmol/L (ref 22–32)
Calcium: 8.9 mg/dL (ref 8.9–10.3)
Chloride: 104 mmol/L (ref 98–111)
Creatinine, Ser: 0.83 mg/dL (ref 0.61–1.24)
GFR calc Af Amer: >60 mL/min (ref >60)
GFR calc non Af Amer: >60 mL/min (ref >60)
Glucose, Bld: 101 mg/dL — ABNORMAL HIGH (ref 70–99)
Potassium: 3.7 mmol/L (ref 3.5–5.1)
Sodium: 138 mmol/L (ref 135–145)

## 2019-11-01 LAB — CBC WITH DIFFERENTIAL/PLATELET
Abs Immature Granulocytes: 0.04 10*3/uL (ref 0.00–0.07)
Basophils Absolute: 0 10*3/uL (ref 0.0–0.1)
Basophils Relative: 0 %
Eosinophils Absolute: 0.2 10*3/uL (ref 0.0–0.5)
Eosinophils Relative: 2 %
HCT: 45 % (ref 39.0–52.0)
Hemoglobin: 15 g/dL (ref 13.0–17.0)
Immature Granulocytes: 0 %
Lymphocytes Relative: 15 %
Lymphs Abs: 1.4 10*3/uL (ref 0.7–4.0)
MCH: 30.5 pg (ref 26.0–34.0)
MCHC: 33.3 g/dL (ref 30.0–36.0)
MCV: 91.6 fL (ref 80.0–100.0)
Monocytes Absolute: 0.7 10*3/uL (ref 0.1–1.0)
Monocytes Relative: 7 %
Neutro Abs: 7.4 10*3/uL (ref 1.7–7.7)
Neutrophils Relative %: 76 %
Platelets: 235 10*3/uL (ref 150–400)
RBC: 4.91 MIL/uL (ref 4.22–5.81)
RDW: 12.6 % (ref 11.5–15.5)
WBC: 9.7 10*3/uL (ref 4.0–10.5)
nRBC: 0 % (ref 0.0–0.2)

## 2019-11-01 MED ORDER — CLINDAMYCIN HCL 150 MG PO CAPS: 450.0000 mg | ORAL_CAPSULE | Freq: Three times a day (TID) | ORAL | 0 refills | Status: AC

## 2019-11-01 MED ORDER — CLINDAMYCIN PHOSPHATE 600 MG/50ML IV SOLN
600.0000 mg | Freq: Once | INTRAVENOUS | Status: AC
  Administered 2019-11-01: 600 mg via INTRAVENOUS
  Filled 2019-11-01: qty 50

## 2019-11-01 MED ORDER — TRAMADOL HCL 50 MG PO TABS
50.0000 mg | ORAL_TABLET | Freq: Once | ORAL | Status: AC
  Administered 2019-11-01: 17:00:00 50 mg via ORAL
  Filled 2019-11-01: qty 1

## 2019-11-01 MED ORDER — TRAMADOL HCL 50 MG PO TABS
50.0000 mg | ORAL_TABLET | Freq: Four times a day (QID) | ORAL | 0 refills | Status: AC | PRN
Start: 2019-11-01 — End: ?

## 2019-11-01 NOTE — Discharge Instructions (Addendum)
Avoid squeezing this site.  Apply warm compress as discussed 20 minutes several times daily.  Take your next dose of the antibiotic tonight before bed.  You may continue using the tramadol prescribed for pain - this will make you drowsy - do not drive within 4 hours of taking this medicine.

## 2019-11-01 NOTE — ED Provider Notes (Signed)
[facility] EMERGENCY DEPARTMENT Provider Note   CSN: [id number] Arrival date & time: 11/01/19  1533     History Chief Complaint  Patient presents with  . Abscess    nose    Michael Andersen is a 24 y.o. male presenting with a facial abscess which started 4 days as a small pimple at the edge of his right nostril.  He squeezed the site and a small amount of purulence was removed and since that time it has become larger and more tender.  He does have a history of prior abscesses reporting he had an abscess 5 years ago on his face which became severe requiring hospital admission.  Review of the chart reveals no cultures obtained, there is no documentation of any MRSA history.  He applied ice pack to the site which helped with pain but seem to make the swelling worse.  Is also taken ibuprofen which did not improve his pain.  He denies fevers or chills, there is been no further drainage from the site.  He denies dental pain mouth pain or swelling.   The history is provided by the patient.       History reviewed. No pertinent past medical history.  Patient Active Problem List   Diagnosis Date Noted  . Cellulitis 03/22/2015  . Facial cellulitis 03/22/2015    Past Surgical History:  Procedure Laterality Date  . TONSILLECTOMY         Family History  Problem Relation Age of Onset  . Cancer Other     Social History   Tobacco Use  . Smoking status: Never Smoker  . Smokeless tobacco: Never Used  Substance Use Topics  . Alcohol use: No  . Drug use: No    Home Medications Prior to Admission medications   Medication Sig Start Date End Date Taking? Authorizing Provider  clindamycin (CLEOCIN) 150 MG capsule Take 3 capsules (450 mg total) by mouth 3 (three) times daily. 11/01/19   Burgess Amor, PA-C  doxycycline (VIBRA-TABS) 100 MG tablet Take 1 tablet (100 mg total) by mouth 2 (two) times daily. 03/26/15   Philip Aspen, Limmie Patricia, MD  ibuprofen (ADVIL,MOTRIN) 200 MG tablet Take  400 mg by mouth every 6 (six) hours as needed (pain).    [provider]  traMADol (ULTRAM) 50 MG tablet Take 1 tablet (50 mg total) by mouth every 6 (six) hours as needed. 11/01/19   Burgess Amor, PA-C    Allergies    Vicodin [hydrocodone-acetaminophen]  Review of Systems   Review of Systems  Constitutional: Negative for chills and fever.  HENT: Positive for facial swelling. Negative for congestion, sore throat and trouble swallowing.   Eyes: Negative.   Respiratory: Negative for chest tightness and shortness of breath.   Cardiovascular: Negative for chest pain.  Gastrointestinal: Negative for nausea and vomiting.  Genitourinary: Negative.   Musculoskeletal: Negative for arthralgias, joint swelling, myalgias and neck pain.  Skin: Positive for wound. Negative for rash.  Neurological: Negative for weakness, light-headedness and headaches.  Psychiatric/Behavioral: Negative.     Physical Exam Updated Vital Signs BP 125/85 (BP Location: Right Arm)   Pulse 90   Temp 98.6 F (37 C) (Oral)   Resp 16   Ht 6\' 2"  (1.88 m)   Wt 88.9 kg   SpO2 100%   BMI 25.16 kg/m   Physical Exam Constitutional:      Appearance: He is well-developed.  HENT:     Head: Normocephalic.     Nose:  Comments: Patient has an approximate 1 cm induration beneath his right nostril/upper lip.  There is no fluctuance, pointing or drainage from the site.  His lips are unaffected.  His internal nares appear normal with no induration or edema.  There is no facial erythema.    Mouth/Throat:     Mouth: Mucous membranes are moist.     Dentition: No dental tenderness or gum lesions.  Eyes:     Extraocular Movements: Extraocular movements intact.     Conjunctiva/sclera: Conjunctivae normal.  Cardiovascular:     Rate and Rhythm: Normal rate.  Pulmonary:     Effort: Pulmonary effort is normal.  Neurological:     Mental Status: He is alert and oriented to person, place, and time.     Sensory: No sensory  deficit.     ED Results / Procedures / Treatments   Labs (all labs ordered are listed, but only abnormal results are displayed) Labs Reviewed  BASIC METABOLIC PANEL - Abnormal; Notable for the following components:      Result Value   Glucose, Bld 101 (*)    All other components within normal limits  CBC WITH DIFFERENTIAL/PLATELET    EKG None  Radiology No results found.  Procedures Procedures (including critical care time)  Medications Ordered in ED Medications  clindamycin (CLEOCIN) IVPB 600 mg (0 mg Intravenous Stopped 11/01/19 1759)  traMADol (ULTRAM) tablet 50 mg (50 mg Oral Given 11/01/19 1726)    ED Course  I have reviewed the triage vital signs and the nursing notes.  Pertinent labs & imaging results that were available during my care of the patient were reviewed by me and considered in my medical decision making (see chart for details).    MDM Rules/Calculators/A&P                      Patient with a small facial abscess with out fluctuance or palpable fluid pocket.  There is no facial cellulitis.  He was given IV clindamycin and prescribed the same medication for home use.  Discussed warm compresses, avoiding heat ice packs.  Prescribed tramadol.  Strict return precautions were discussed including a recheck within 48 hours if his symptoms are not improving with this treatment.  Patient understands plan. Final Clinical Impression(s) / ED Diagnoses Final diagnoses:  Facial abscess    Rx / DC Orders ED Discharge Orders         Ordered    clindamycin (CLEOCIN) 150 MG capsule  3 times daily     11/01/19 1752    traMADol (ULTRAM) 50 MG tablet  Every 6 hours PRN     11/01/19 1752           Evalee Jefferson, PA-C 11/03/19 0151    Wyvonnia Dusky, MD 11/04/19 1055

## 2019-11-01 NOTE — ED Triage Notes (Signed)
Denies drug usage   Abscess to nares of nose x 4 days   Has had DC but not now

## 2020-07-13 ENCOUNTER — Emergency Department (HOSPITAL_COMMUNITY): Payer: Self-pay

## 2020-07-13 ENCOUNTER — Other Ambulatory Visit: Payer: Self-pay

## 2020-07-13 ENCOUNTER — Emergency Department (HOSPITAL_COMMUNITY)
Admission: EM | Admit: 2020-07-13 | Discharge: 2020-07-13 | Disposition: A | Discharge status: Home / Self Care | Payer: Self-pay | Attending: Emergency Medicine | Admitting: Emergency Medicine

## 2020-07-13 ENCOUNTER — Encounter (HOSPITAL_COMMUNITY): Payer: Self-pay

## 2020-07-13 DIAGNOSIS — S62326A Displaced fracture of shaft of fifth metacarpal bone, right hand, initial encounter for closed fracture: Secondary | ICD-10-CM | POA: Insufficient documentation

## 2020-07-13 DIAGNOSIS — Y9389 Activity, other specified: Secondary | ICD-10-CM | POA: Insufficient documentation

## 2020-07-13 DIAGNOSIS — W231XXA Caught, crushed, jammed, or pinched between stationary objects, initial encounter: Secondary | ICD-10-CM | POA: Insufficient documentation

## 2020-07-13 DIAGNOSIS — Y990 Civilian activity done for income or pay: Secondary | ICD-10-CM | POA: Insufficient documentation

## 2020-07-13 MED ORDER — OXYCODONE-ACETAMINOPHEN 5-325 MG PO TABS
1.0000 | ORAL_TABLET | Freq: Once | ORAL | Status: AC
  Administered 2020-07-13: 1 via ORAL
  Filled 2020-07-13: qty 1

## 2020-07-13 MED ORDER — SENNOSIDES-DOCUSATE SODIUM 8.6-50 MG PO TABS: 1.0000 | ORAL_TABLET | Freq: Every day | ORAL | 0 refills | Status: AC

## 2020-07-13 MED ORDER — IBUPROFEN 800 MG PO TABS
800.0000 mg | ORAL_TABLET | Freq: Once | ORAL | Status: AC
  Administered 2020-07-13: 800 mg via ORAL
  Filled 2020-07-13: qty 1

## 2020-07-13 MED ORDER — OXYCODONE-ACETAMINOPHEN 5-325 MG PO TABS
1.0000 | ORAL_TABLET | Freq: Four times a day (QID) | ORAL | 0 refills | Status: AC | PRN
Start: 2020-07-13 — End: ?

## 2020-07-13 MED ORDER — IBUPROFEN 800 MG PO TABS: 800.0000 mg | ORAL_TABLET | Freq: Three times a day (TID) | ORAL | 0 refills | Status: AC | PRN

## 2020-07-13 NOTE — ED Triage Notes (Signed)
Pt to er, pt states that he was working on a car and when the bold came loose he hit his hand on the car motor, pt has swelling to R hand, pt c/o pain to R hand.

## 2020-07-13 NOTE — Discharge Instructions (Signed)
You were seen in the emerge department today with fracture of your right hand.  Please keep your splint clean and dry.  Keep it elevated to prevent swelling and you can apply ice intermittently over the next 12 hours.  You can take the pain medication as prescribed.  The Percocet can make you drowsy and impair your ability to drive a car.  It can also cause constipation.  Please call the orthopedic surgeon listed to schedule the next available follow-up appointment.  Please tell them he broke her hand and were in the emergency department today.  They should get you in within the next 2 to 3 days.

## 2020-07-13 NOTE — ED Provider Notes (Signed)
Emergency Department Provider Note   I have reviewed the triage vital signs and the nursing notes.   HISTORY  Chief Complaint Hand Pain   HPI Michael Andersen is a 24 y.o. male presents to the ED with right hand pain after injury. Patient was working on his car when her crushed his hand under a bar while trying to tighten a bolt. There was no bleeding. Denies numbness. Patient is right hand dominant. No fever. Pain is moderate and worse with movement.   History reviewed. No pertinent past medical history.  Patient Active Problem List   Diagnosis Date Noted  . Cellulitis 03/22/2015  . Facial cellulitis 03/22/2015    Past Surgical History:  Procedure Laterality Date  . TONSILLECTOMY      Allergies Vicodin [hydrocodone-acetaminophen]  Family History  Problem Relation Age of Onset  . Cancer Other     Social History Social History   Tobacco Use  . Smoking status: Never Smoker  . Smokeless tobacco: Never Used  Vaping Use  . Vaping Use: Every day  Substance Use Topics  . Alcohol use: No  . Drug use: No    Review of Systems  Constitutional: No fever/chills Cardiovascular: Denies chest pain. Respiratory: Denies shortness of breath. Gastrointestinal: No abdominal pain.   Musculoskeletal: Positive right hand pain.  Skin: Negative for rash. Neurological: Negative for focal weakness or numbness.  10-point ROS otherwise negative.  ____________________________________________   PHYSICAL EXAM:  VITAL SIGNS: ED Triage Vitals [07/13/20 2007]  Enc Vitals Group     BP (!) 148/108     Pulse Rate 87     Resp 18     Temp 98.3 F (36.8 C)     Temp Source Oral     SpO2 100 %     Weight 202 lb (91.6 kg)     Height 6\' 2"  (1.88 m)   Constitutional: Alert and oriented. Well appearing and in no acute distress. Eyes: Conjunctivae are normal.  Head: Atraumatic. Nose: No congestion/rhinnorhea. Mouth/Throat: Mucous membranes are moist.  Neck: No stridor.    Cardiovascular: Normal rate, regular rhythm. Good peripheral circulation. Grossly normal heart sounds.   Respiratory: Normal respiratory effort.   Gastrointestinal: No distention.  Musculoskeletal: Edema over the right lateral hand. Normal ROM of the fingers. No wrist tenderness.  Neurologic:  Normal speech and language. No gross focal neurologic deficits are appreciated.  Skin:  Skin is warm, dry and intact. No rash noted.  ____________________________________________  RADIOLOGY  DG Hand Complete Right  Result Date: 07/13/2020 CLINICAL DATA:  24 year old male with trauma to the right hand. EXAM: RIGHT HAND - COMPLETE 3+ VIEW COMPARISON:  None. FINDINGS: There is a transverse fracture of the proximal aspect of the fifth metacarpal with mild dorsal apex angulation. No dislocation. The bones are well mineralized. There is soft tissue swelling over the medial hand. No radiopaque foreign object or soft tissue gas. IMPRESSION: Mildly angulated fracture of the proximal fifth metacarpal. Electronically Signed   By: 22 M.D.   On: 07/13/2020 20:51    ____________________________________________   PROCEDURES  Procedure(s) performed:   .Splint Application  Date/Time: 07/17/2020 2:05 PM Performed by: 07/19/2020, MD Authorized by: Maia Plan, MD   Consent:    Consent obtained:  Verbal   Consent given by:  Patient   Risks discussed:  Discoloration, numbness, pain and swelling Pre-procedure details:    Sensation:  Normal   Skin color:  Normal  Procedure details:  Laterality:  Right   Location:  Hand   Hand:  R hand   Strapping: no     Cast type:  Short arm   Splint type:  Ulnar gutter   Supplies:  Ortho-Glass Post-procedure details:    Pain:  Improved   Sensation:  Normal   Skin color:  Normal    Patient tolerance of procedure:  Tolerated well, no immediate complications     ____________________________________________   INITIAL IMPRESSION /  ASSESSMENT AND PLAN / ED COURSE  Pertinent labs & imaging results that were available during my care of the patient were reviewed by me and considered in my medical decision making (see chart for details).   Patient presents to the ED with right hand pain and swelling. Noted to have a fracture on my review of x-ray. Patient placed in splint. No open fracture. Plan for close ortho/hand follow up. Patient to call office in the AM for appointment this week. Discussed RICE and ED return precautions. [place] drug database reviewed prior to [facility]. Discussed use only for breakthrough pain. Discussed cannot mix with EtOH or other sedating meds.    ____________________________________________  FINAL CLINICAL IMPRESSION(S) / ED DIAGNOSES  Final diagnoses:  Closed displaced fracture of shaft of fifth metacarpal bone of right hand, initial encounter     MEDICATIONS GIVEN DURING THIS VISIT:  Medications  oxyCODONE-acetaminophen (PERCOCET/ROXICET) 5-325 MG per tablet 1 tablet (1 tablet Oral Given 07/13/20 2233)  ibuprofen (ADVIL) tablet 800 mg (800 mg Oral Given 07/13/20 2233)     NEW OUTPATIENT MEDICATIONS STARTED DURING THIS VISIT:  Discharge Medication List as of 07/13/2020 10:53 PM    START taking these medications   Details  oxyCODONE-acetaminophen (PERCOCET/ROXICET) 5-325 MG tablet Take 1 tablet by mouth every 6 (six) hours as needed for severe pain., Starting Sun 07/13/2020, Normal    senna-docusate (SENOKOT-S) 8.6-50 MG tablet Take 1 tablet by mouth daily., Starting Sun 07/13/2020, Normal        Note:  This document was prepared using Dragon voice recognition software and may include unintentional dictation errors.  Alona Bene, MD, [facility] Emergency Medicine    Jamarl Pew, Arlyss Repress, MD 07/17/20 [phone]

## 2021-05-23 IMAGING — DX DG HAND COMPLETE 3+V*R*
3 series · 3 of 3 positions shown · non-contrast
Comparison: None.

CLINICAL DATA: 24-year-old male with trauma to the right hand.

EXAM:
RIGHT HAND - COMPLETE 3+ VIEW

[hand pa]
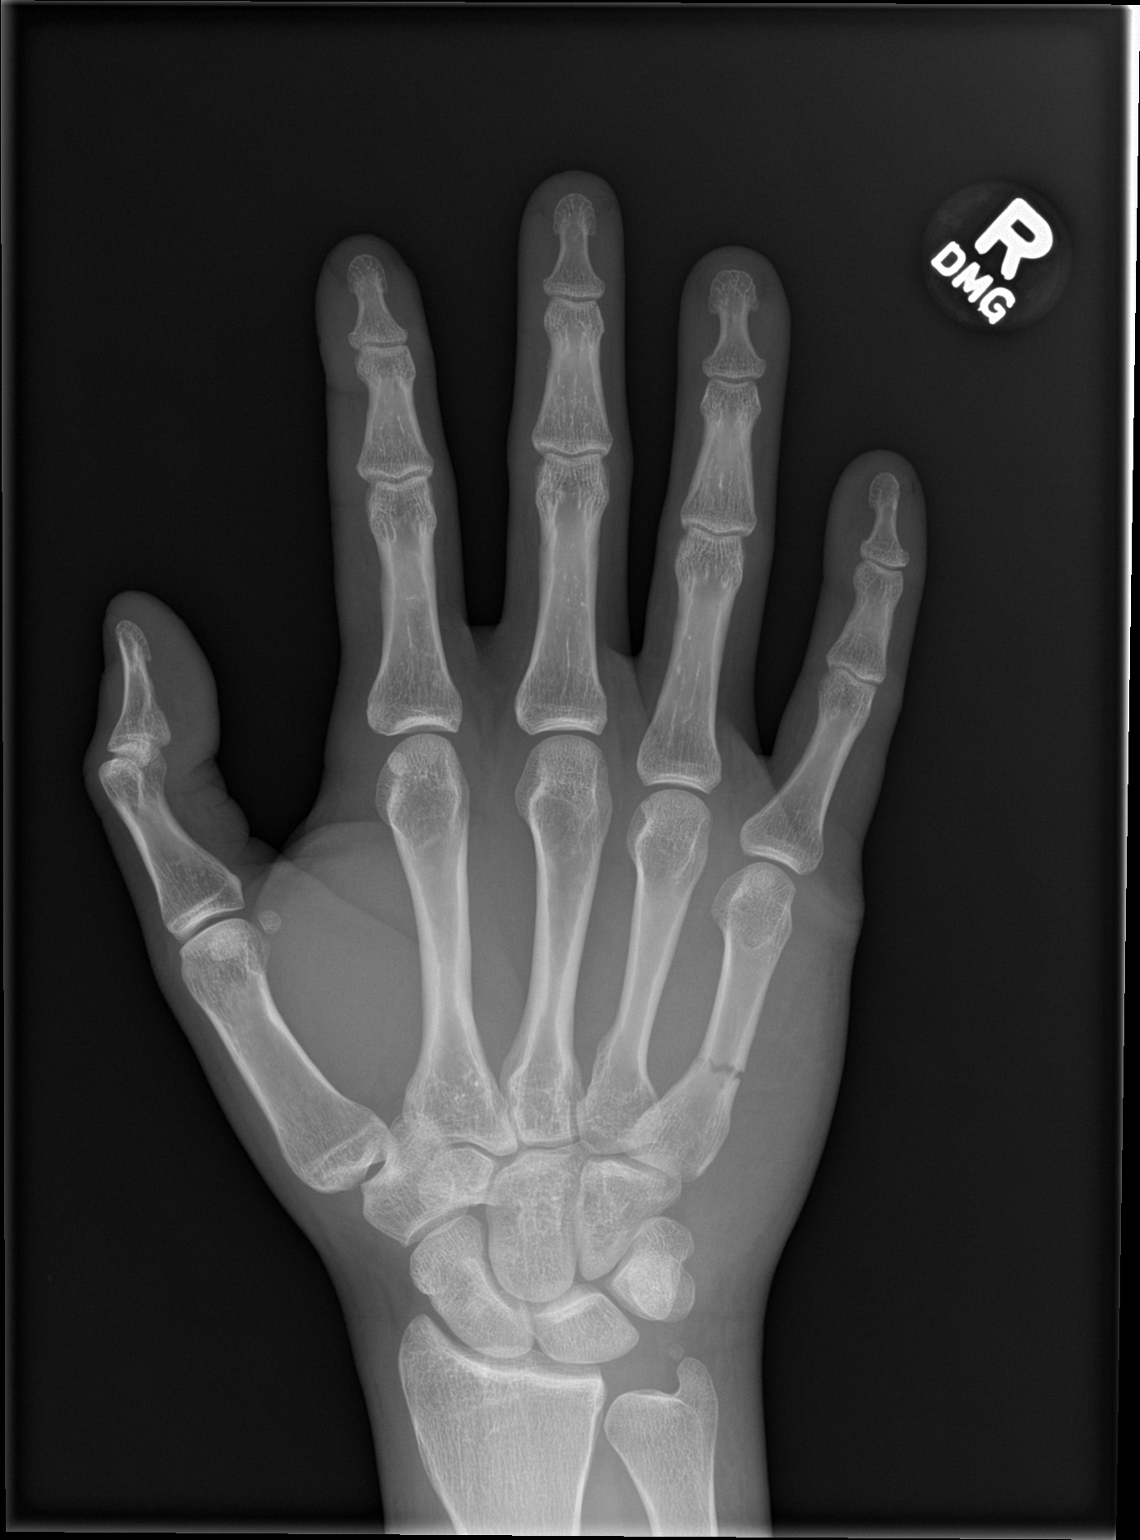

[hand obl]
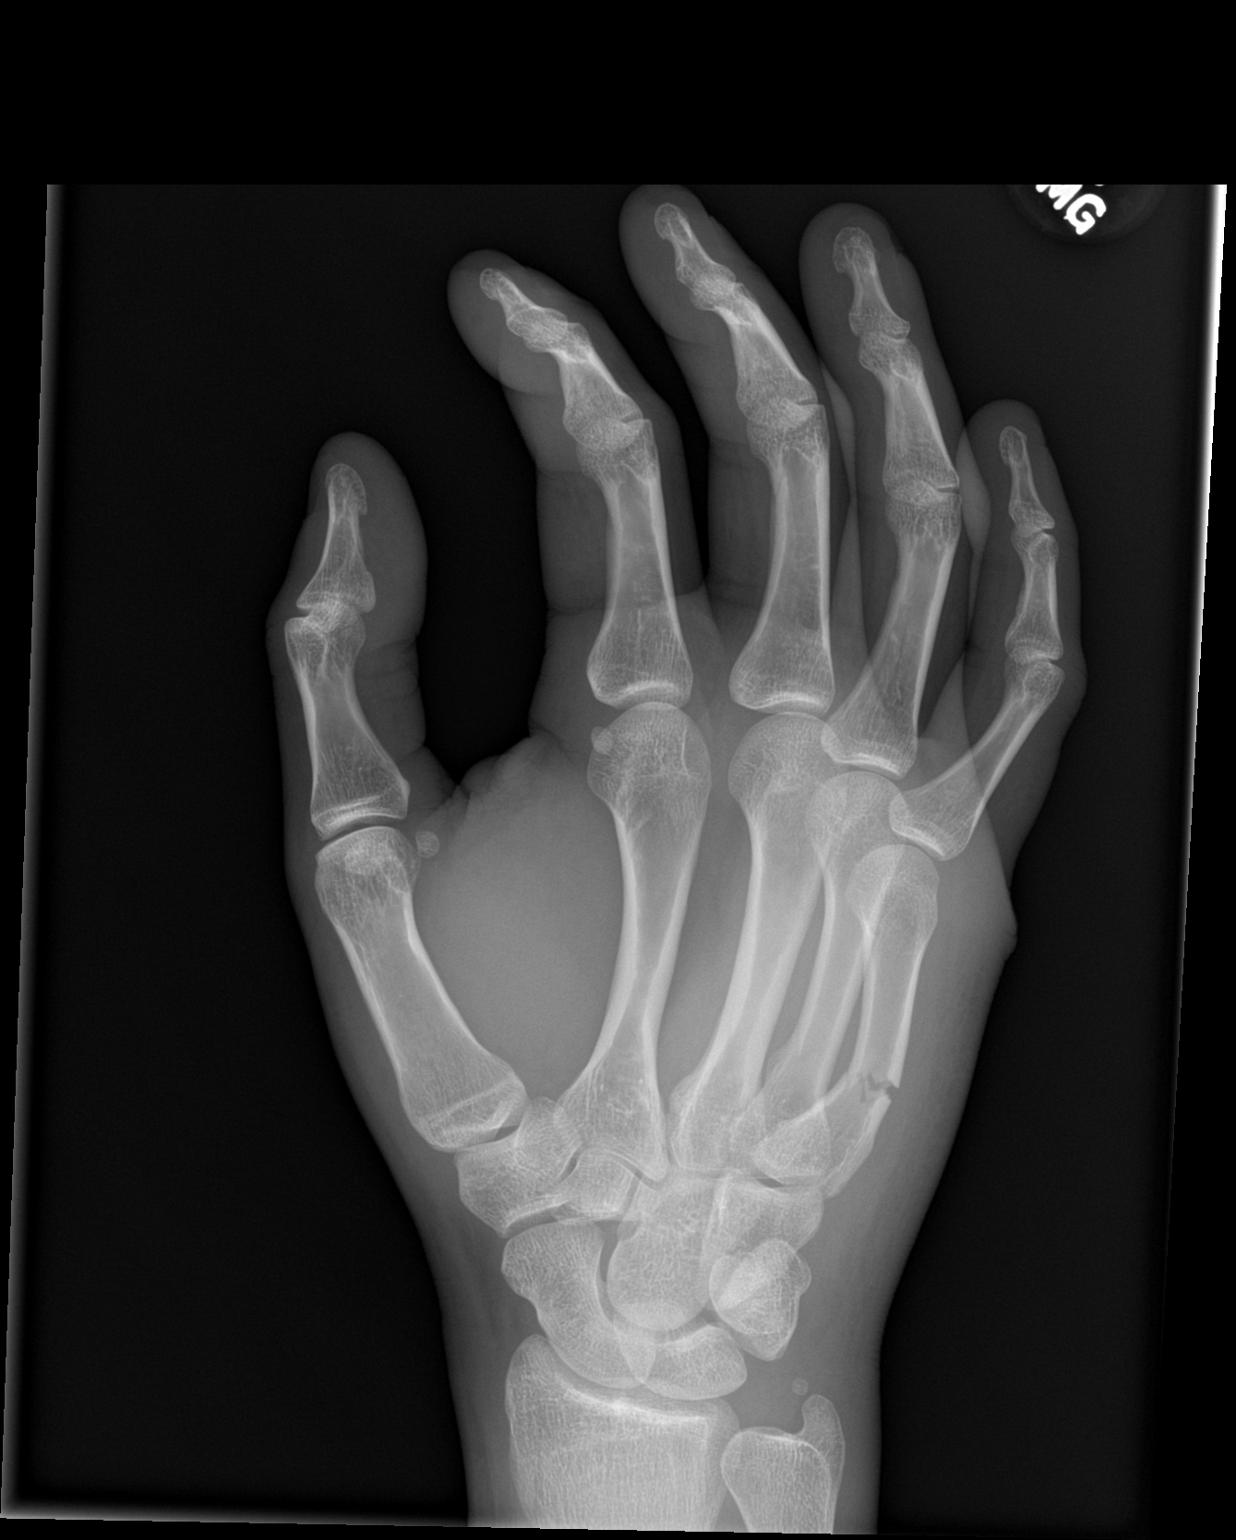

[hand lat]
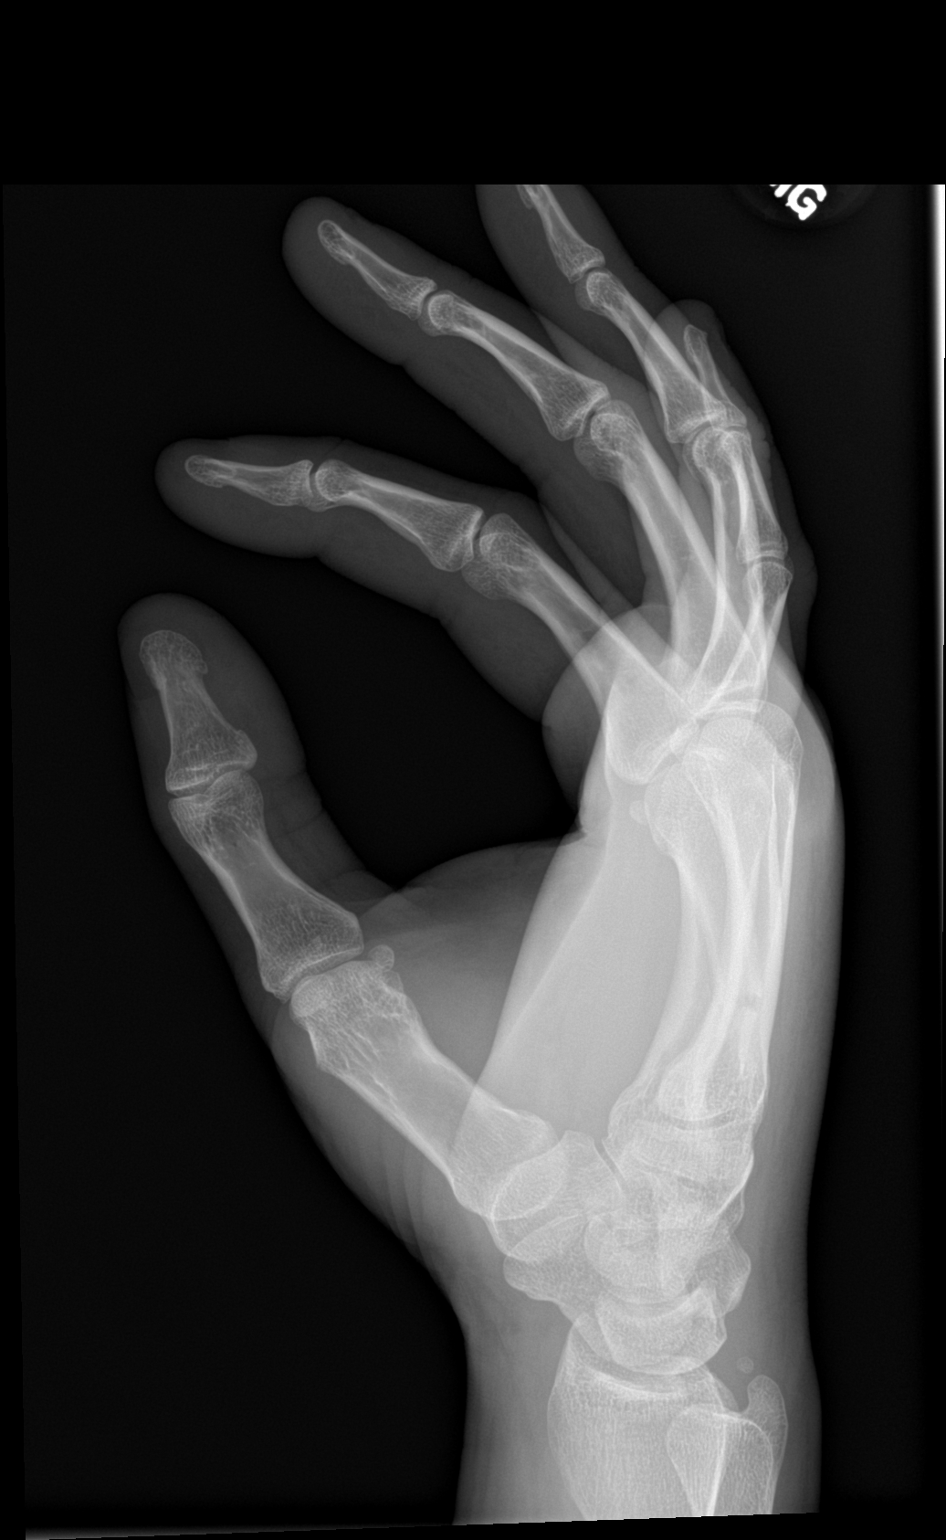

[3 of 3 positions shown; findings below may reference images not displayed]

FINDINGS: There is a transverse fracture of the proximal aspect of the fifth
metacarpal with mild dorsal apex angulation. No dislocation. The
bones are well mineralized. There is soft tissue swelling over the
medial hand. No radiopaque foreign object or soft tissue gas.
IMPRESSION: Mildly angulated fracture of the proximal fifth metacarpal.

## 2024-09-05 ENCOUNTER — Other Ambulatory Visit: Payer: Self-pay

## 2024-09-05 ENCOUNTER — Emergency Department (HOSPITAL_COMMUNITY)

## 2024-09-05 ENCOUNTER — Emergency Department (HOSPITAL_COMMUNITY)
Admission: EM | Admit: 2024-09-05 | Discharge: 2024-09-05 | Disposition: A | Discharge status: Home / Self Care | Attending: Emergency Medicine | Admitting: Emergency Medicine

## 2024-09-05 ENCOUNTER — Encounter (HOSPITAL_COMMUNITY): Payer: Self-pay

## 2024-09-05 DIAGNOSIS — S61412A Laceration without foreign body of left hand, initial encounter: Secondary | ICD-10-CM | POA: Insufficient documentation

## 2024-09-05 DIAGNOSIS — Y93L1 Activity, splitting wood: Secondary | ICD-10-CM | POA: Diagnosis not present

## 2024-09-05 DIAGNOSIS — S6992XA Unspecified injury of left wrist, hand and finger(s), initial encounter: Secondary | ICD-10-CM | POA: Diagnosis present

## 2024-09-05 DIAGNOSIS — Z23 Encounter for immunization: Secondary | ICD-10-CM | POA: Insufficient documentation

## 2024-09-05 DIAGNOSIS — W270XXA Contact with workbench tool, initial encounter: Secondary | ICD-10-CM | POA: Diagnosis not present

## 2024-09-05 MED ORDER — TETANUS-DIPHTH-ACELL PERTUSSIS 5-2-15.5 LF-MCG/0.5 IM SUSP
0.5000 mL | Freq: Once | INTRAMUSCULAR | Status: AC
  Administered 2024-09-05: 0.5 mL via INTRAMUSCULAR
  Filled 2024-09-05: qty 0.5

## 2024-09-05 MED ORDER — LIDOCAINE-EPINEPHRINE (PF) 2 %-1:200000 IJ SOLN
20.0000 mL | Freq: Once | INTRAMUSCULAR | Status: AC
  Administered 2024-09-05: 20 mL
  Filled 2024-09-05: qty 20

## 2024-09-05 NOTE — ED Notes (Signed)
 ED Provider at bedside.

## 2024-09-05 NOTE — ED Notes (Signed)
"  Portable xray at bedside  "

## 2024-09-05 NOTE — ED Triage Notes (Signed)
 Pt cut middle of left hand with axe x30 mins ago. Laceration wrapped and  bleeding controlled at this time.

## 2024-09-05 NOTE — ED Provider Notes (Signed)
 "  [place] EMERGENCY DEPARTMENT AT [facility]  Provider Note  CSN: [id number] Arrival date & time: 09/05/24 0112  History Chief Complaint  Patient presents with   Hand Injury    Michael Andersen is a 29 y.o. male reports he was chopping wood earlier tonight when he sustained a laceration on L hand from the axe. Reports significant bleeding.    Home Medications Prior to Admission medications  Medication Sig Start Date End Date Taking? Authorizing Provider  clindamycin  (CLEOCIN ) 150 MG capsule Take 3 capsules (450 mg total) by mouth 3 (three) times daily. 11/01/19   Idol, Julie, PA-C  doxycycline  (VIBRA -TABS) 100 MG tablet Take 1 tablet (100 mg total) by mouth 2 (two) times daily. 03/26/15   Theophilus Andrews, Tully GRADE, MD  ibuprofen  (ADVIL ) 800 MG tablet Take 1 tablet (800 mg total) by mouth every 8 (eight) hours as needed for moderate pain. 07/13/20   Long, Fonda MATSU, MD  oxyCODONE -acetaminophen  (PERCOCET/ROXICET) 5-325 MG tablet Take 1 tablet by mouth every 6 (six) hours as needed for severe pain. 07/13/20   Long, Joshua G, MD  senna-docusate (SENOKOT-S) 8.6-50 MG tablet Take 1 tablet by mouth daily. 07/13/20   Long, Fonda MATSU, MD  traMADol  (ULTRAM ) 50 MG tablet Take 1 tablet (50 mg total) by mouth every 6 (six) hours as needed. 11/01/19   Idol, Julie, PA-C     Allergies    Vicodin [hydrocodone -acetaminophen ]   Review of Systems   Review of Systems Please see HPI for pertinent positives and negatives  Physical Exam BP 132/72   Pulse 100   Resp 17   SpO2 97%   Physical Exam Vitals and nursing note reviewed.  HENT:     Head: Normocephalic.     Nose: Nose normal.  Eyes:     Extraocular Movements: Extraocular movements intact.  Pulmonary:     Effort: Pulmonary effort is normal.  Musculoskeletal:        General: Normal range of motion.     Cervical back: Neck supple.     Comments: 3cm superficial laceration to L hand over the 2nd MCP joint radially  Skin:     Findings: No rash (on exposed skin).  Neurological:     Mental Status: He is alert and oriented to person, place, and time.  Psychiatric:        Mood and Affect: Mood normal.     ED Results / Procedures / Treatments   EKG None  Procedures .Laceration Repair  Date/Time: 09/05/2024 3:57 AM  Performed by: Roselyn Carlin NOVAK, MD Authorized by: Roselyn Carlin NOVAK, MD   Consent:    Consent obtained:  Verbal   Consent given by:  Patient Anesthesia:    Anesthesia method:  Local infiltration   Local anesthetic:  Lidocaine  2% WITH epi Laceration details:    Location:  Hand   Hand location:  L hand, dorsum   Length (cm):  3 Pre-procedure details:    Preparation:  Patient was prepped and draped in usual sterile fashion Treatment:    Area cleansed with:  Saline   Amount of cleaning:  Standard   Irrigation solution:  Sterile saline   Irrigation method:  Syringe Skin repair:    Repair method:  Sutures   Suture size:  4-0   Suture material:  Nylon   Suture technique:  Simple interrupted   Number of sutures:  5 Approximation:    Approximation:  Close Post-procedure details:    Dressing:  Non-adherent dressing  Procedure completion:  Tolerated well, no immediate complications   Medications Ordered in the ED Medications  Tdap (ADACEL ) injection 0.5 mL (0.5 mLs Intramuscular Given 09/05/24 0340)  lidocaine -EPINEPHrine  (XYLOCAINE  W/EPI) 2 %-1:200000 (PF) injection 20 mL (20 mLs Infiltration Given by Other 09/05/24 0341)    Initial Impression and Plan  Patient here with hand laceration, I personally viewed the images from radiology studies and agree with radiologist interpretation: xrays neg for fracture or FB. Wound repaired as above. Wound care instructions given, suture removal in 7-10 days.   ED Course       MDM Rules/Calculators/A&P Medical Decision Making Problems Addressed: Laceration of left hand without foreign body, initial encounter: acute illness or injury  Amount  and/or Complexity of Data Reviewed Radiology: ordered and independent interpretation performed. Decision-making details documented in ED Course.  Risk Prescription drug management.     Final Clinical Impression(s) / ED Diagnoses Final diagnoses:  Laceration of left hand without foreign body, initial encounter    Rx / DC Orders ED Discharge Orders     None        Roselyn Carlin NOVAK, MD 09/05/24 [phone]  "
